# Patient Record
Sex: Male | Born: 1970
Health system: Southern US, Community
[De-identification: ages and names within clinical notes are randomized; demographics above are authoritative.]

## PROBLEM LIST (undated history)

## (undated) DIAGNOSIS — K219 Gastro-esophageal reflux disease without esophagitis: Secondary | ICD-10-CM

## (undated) DIAGNOSIS — I10 Essential (primary) hypertension: Secondary | ICD-10-CM

## (undated) DIAGNOSIS — E7800 Pure hypercholesterolemia, unspecified: Secondary | ICD-10-CM

## (undated) DIAGNOSIS — N189 Chronic kidney disease, unspecified: Secondary | ICD-10-CM

## (undated) DIAGNOSIS — K5900 Constipation, unspecified: Secondary | ICD-10-CM

## (undated) DIAGNOSIS — N19 Unspecified kidney failure: Secondary | ICD-10-CM

## (undated) DIAGNOSIS — M109 Gout, unspecified: Secondary | ICD-10-CM

## (undated) HISTORY — PX: NO PAST SURGERIES: SHX2092

---

## 2001-01-04 ENCOUNTER — Emergency Department (HOSPITAL_COMMUNITY): Admission: EM | Admit: 2001-01-04 | Discharge: 2001-01-04 | Payer: Self-pay | Admitting: Emergency Medicine

## 2004-09-09 ENCOUNTER — Ambulatory Visit: Payer: Self-pay | Admitting: Internal Medicine

## 2004-09-11 ENCOUNTER — Ambulatory Visit: Payer: Self-pay | Admitting: *Deleted

## 2011-08-13 ENCOUNTER — Encounter (HOSPITAL_COMMUNITY): Payer: Self-pay | Admitting: *Deleted

## 2011-08-13 ENCOUNTER — Emergency Department (HOSPITAL_COMMUNITY)
Admission: EM | Admit: 2011-08-13 | Discharge: 2011-08-14 | Disposition: A | Discharge status: Home / Self Care | Payer: Self-pay | Attending: Emergency Medicine | Admitting: Emergency Medicine

## 2011-08-13 DIAGNOSIS — I10 Essential (primary) hypertension: Secondary | ICD-10-CM | POA: Insufficient documentation

## 2011-08-13 DIAGNOSIS — R110 Nausea: Secondary | ICD-10-CM | POA: Insufficient documentation

## 2011-08-13 HISTORY — DX: Essential (primary) hypertension: I10

## 2011-08-13 MED ORDER — LOSARTAN POTASSIUM 25 MG PO TABS: 50.0000 mg | ORAL_TABLET | Freq: Every day | ORAL | Status: DC

## 2011-08-13 NOTE — ED Notes (Addendum)
Concerned with elevated blood pressure; feels like he chokes sometimes when eating food; nauseated when eating; feels like needles on skin

## 2011-08-13 NOTE — ED Provider Notes (Signed)
History     CSN: [id number]  Arrival date & time 08/13/11  2250   First MD Initiated Contact with Patient 08/13/11 2311      Chief Complaint  Patient presents with  . Nausea    (Consider location/radiation/quality/duration/timing/severity/associated sxs/prior treatment) HPI 41 year old male presents to emergency part complaining of nausea and elevated blood pressure. Patient has history of hypertension, currently treated with losartan. He's been on the medication for 3 years. Patient was unable to sleep last night, and had nausea. Nausea persisted through the day today. Patient does not have difficulty swallowing or choking sensation, but reports when he tries to eat the food does not taste good. He attributes this to his high blood pressure. Patient had episode of pins and needles in his skin last night and again today, which he also attributes to his high blood pressure. He denies any chest pain, shortness of breath, abdominal pain, blurred vision. Patient reports similar episode about a week ago, but did not check his blood pressure at the time. At home, his blood pressure was 150s over 90s. Patient reports his last checkup with his primary care physician was 3 months ago.  Past Medical History  Diagnosis Date  . Hypertension     History reviewed. No pertinent past surgical history.  No family history on file.  History  Substance Use Topics  . Smoking status: Never Smoker   . Smokeless tobacco: Not on file  . Alcohol Use: No      Review of Systems  All other systems reviewed and are negative.    Allergies  Review of patient's allergies indicates no known allergies.  Home Medications   Current Outpatient Rx  Name Route Sig Dispense Refill  . CALCIUM CARBONATE-VITAMIN D 500-200 MG-UNIT PO TABS Oral Take 1 tablet by mouth daily.    Marland Kitchen LOSARTAN POTASSIUM 25 MG PO TABS Oral Take 25 mg by mouth daily.    Marland Kitchen SIMVASTATIN 20 MG PO TABS Oral Take 20 mg by mouth every evening.       BP 164/101  Pulse 68  Temp 98.1 F (36.7 C) (Oral)  Resp 18  SpO2 100%  Physical Exam  Nursing note and vitals reviewed. Constitutional: He is oriented to person, place, and time. He appears well-developed and well-nourished. No distress.  HENT:  Head: Normocephalic and atraumatic.  Nose: Nose normal.  Mouth/Throat: Oropharynx is clear and moist. No oropharyngeal exudate.  Eyes: Conjunctivae and EOM are normal. Pupils are equal, round, and reactive to light.  Neck: Normal range of motion. Neck supple. No JVD present. No tracheal deviation present. No thyromegaly present.  Cardiovascular: Normal rate, regular rhythm, normal heart sounds and intact distal pulses.  Exam reveals no gallop and no friction rub.   No murmur heard. Pulmonary/Chest: Effort normal and breath sounds normal. No stridor. No respiratory distress. He has no wheezes. He has no rales. He exhibits no tenderness.  Abdominal: Soft. Bowel sounds are normal. He exhibits no distension and no mass. There is no tenderness. There is no rebound and no guarding.  Musculoskeletal: Normal range of motion. He exhibits no edema and no tenderness.  Lymphadenopathy:    He has no cervical adenopathy.  Neurological: He is alert and oriented to person, place, and time. He exhibits normal muscle tone. Coordination normal.  Skin: Skin is warm and dry. No rash noted. He is not diaphoretic. No erythema. No pallor.    ED Course  Procedures (including critical care time)  Labs Reviewed - No  data to display No results found.   1. Hypertension       MDM  41 year old male with hypertension. Patient is on lower dose of losartan, will increase to 50 mg daily. We'll have him keep a journal of blood pressures and followup with his primary care physician in one week. Patient without evidence of endorgan damage and his blood pressure is not dangerously elevated.        Kalman Drape, MD 08/13/11 2351

## 2011-10-02 ENCOUNTER — Inpatient Hospital Stay (HOSPITAL_COMMUNITY)
Admission: EM | Admit: 2011-10-02 | Discharge: 2011-10-10 | DRG: 674 | Disposition: A | Discharge status: Home / Self Care | Payer: Medicaid Other | Attending: Nephrology | Admitting: Nephrology

## 2011-10-02 ENCOUNTER — Encounter (HOSPITAL_COMMUNITY): Payer: Self-pay | Admitting: Emergency Medicine

## 2011-10-02 DIAGNOSIS — R809 Proteinuria, unspecified: Secondary | ICD-10-CM

## 2011-10-02 DIAGNOSIS — N186 End stage renal disease: Secondary | ICD-10-CM | POA: Diagnosis present

## 2011-10-02 DIAGNOSIS — D638 Anemia in other chronic diseases classified elsewhere: Secondary | ICD-10-CM | POA: Diagnosis present

## 2011-10-02 DIAGNOSIS — N179 Acute kidney failure, unspecified: Principal | ICD-10-CM | POA: Diagnosis present

## 2011-10-02 DIAGNOSIS — R609 Edema, unspecified: Secondary | ICD-10-CM

## 2011-10-02 DIAGNOSIS — R319 Hematuria, unspecified: Secondary | ICD-10-CM

## 2011-10-02 DIAGNOSIS — I120 Hypertensive chronic kidney disease with stage 5 chronic kidney disease or end stage renal disease: Secondary | ICD-10-CM | POA: Diagnosis present

## 2011-10-02 DIAGNOSIS — N2581 Secondary hyperparathyroidism of renal origin: Secondary | ICD-10-CM | POA: Diagnosis present

## 2011-10-02 HISTORY — DX: Chronic kidney disease, unspecified: N18.9

## 2011-10-02 LAB — URINE MICROSCOPIC-ADD ON

## 2011-10-02 LAB — CBC WITH DIFFERENTIAL/PLATELET
Basophils Absolute: 0 10*3/uL (ref 0.0–0.1)
Basophils Relative: 0 % (ref 0–1)
Eosinophils Absolute: 0.1 10*3/uL (ref 0.0–0.7)
Eosinophils Relative: 1 % (ref 0–5)
HCT: 30.8 % — ABNORMAL LOW (ref 39.0–52.0)
Hemoglobin: 10.5 g/dL — ABNORMAL LOW (ref 13.0–17.0)
Lymphocytes Relative: 14 % (ref 12–46)
Lymphs Abs: 0.7 10*3/uL (ref 0.7–4.0)
MCH: 28.7 pg (ref 26.0–34.0)
MCHC: 34.1 g/dL (ref 30.0–36.0)
MCV: 84.2 fL (ref 78.0–100.0)
Monocytes Absolute: 0.4 10*3/uL (ref 0.1–1.0)
Monocytes Relative: 8 % (ref 3–12)
Neutro Abs: 4.1 10*3/uL (ref 1.7–7.7)
Neutrophils Relative %: 78 % — ABNORMAL HIGH (ref 43–77)
Platelets: 104 10*3/uL — ABNORMAL LOW (ref 150–400)
RBC: 3.66 MIL/uL — ABNORMAL LOW (ref 4.22–5.81)
RDW: 12.2 % (ref 11.5–15.5)
WBC: 5.3 10*3/uL (ref 4.0–10.5)

## 2011-10-02 LAB — URINALYSIS, ROUTINE W REFLEX MICROSCOPIC
Bilirubin Urine: NEGATIVE
Glucose, UA: NEGATIVE mg/dL
Ketones, ur: NEGATIVE mg/dL
Leukocytes, UA: NEGATIVE
Nitrite: NEGATIVE
Protein, ur: >300 mg/dL — AB
Specific Gravity, Urine: 1.01 (ref 1.005–1.030)
Urobilinogen, UA: 0.2 mg/dL (ref 0.0–1.0)
pH: 7.5 (ref 5.0–8.0)

## 2011-10-02 LAB — BASIC METABOLIC PANEL
BUN: 134 mg/dL — ABNORMAL HIGH (ref 6–23)
CO2: 21 mEq/L (ref 19–32)
Calcium: 7.1 mg/dL — ABNORMAL LOW (ref 8.4–10.5)
Chloride: 99 mEq/L (ref 96–112)
Creatinine, Ser: 12.72 mg/dL — ABNORMAL HIGH (ref 0.50–1.35)
GFR calc Af Amer: 5 mL/min — ABNORMAL LOW (ref >90)
GFR calc non Af Amer: 4 mL/min — ABNORMAL LOW (ref >90)
Glucose, Bld: 124 mg/dL — ABNORMAL HIGH (ref 70–99)
Potassium: 5 mEq/L (ref 3.5–5.1)
Sodium: 140 mEq/L (ref 135–145)

## 2011-10-02 LAB — PHOSPHORUS: Phosphorus: 5.8 mg/dL — ABNORMAL HIGH (ref 2.3–4.6)

## 2011-10-02 LAB — MAGNESIUM: Magnesium: 2.3 mg/dL (ref 1.5–2.5)

## 2011-10-02 MED ORDER — HYDROCODONE-ACETAMINOPHEN 5-325 MG PO TABS
1.0000 | ORAL_TABLET | ORAL | Status: DC | PRN
  Administered 2011-10-05 – 2011-10-06 (×2): 2 via ORAL
  Administered 2011-10-07: 1 via ORAL
  Filled 2011-10-02: qty 2
  Filled 2011-10-02 (×2): qty 1
  Filled 2011-10-02: qty 2

## 2011-10-02 MED ORDER — SIMVASTATIN 20 MG PO TABS
20.0000 mg | ORAL_TABLET | Freq: Every evening | ORAL | Status: DC
  Administered 2011-10-02 – 2011-10-09 (×8): 20 mg via ORAL
  Filled 2011-10-02 (×9): qty 1

## 2011-10-02 MED ORDER — AMLODIPINE BESYLATE 5 MG PO TABS
5.0000 mg | ORAL_TABLET | Freq: Every day | ORAL | Status: DC
  Administered 2011-10-02 – 2011-10-10 (×9): 5 mg via ORAL
  Filled 2011-10-02 (×9): qty 1

## 2011-10-02 MED ORDER — SODIUM CHLORIDE 0.9 % IJ SOLN
3.0000 mL | Freq: Two times a day (BID) | INTRAMUSCULAR | Status: DC
  Administered 2011-10-02 – 2011-10-09 (×12): 3 mL via INTRAVENOUS

## 2011-10-02 NOTE — Consult Note (Signed)
Referring Provider: No ref. provider found Primary Care Physician:  No primary provider on file. Primary Nephrologist:  Dr. Florene Glen  Reason for Consultation:  Management of Stage 5 CKD   HPI Comments: Patient was sent from [facility] today for admission, patient with new onset end stage renal disease, BUN creatinine on paperwork 131 and 10.9. Newly discovered ESRD Asymptomatic   Past Medical History  Diagnosis Date  . Hypertension   . Chronic kidney disease     Past Surgical History  Procedure Date  . No past surgeries     Prior to Admission medications   Medication Sig Start Date End Date Taking? Authorizing Provider  amLODipine (NORVASC) 5 MG tablet Take 5 mg by mouth daily.   Yes Historical Provider, MD  calcium-vitamin D (OSCAL WITH D) 500-200 MG-UNIT per tablet Take 1 tablet by mouth daily.   Yes Historical Provider, MD  furosemide (LASIX) 20 MG tablet Take 20 mg by mouth daily.   Yes Historical Provider, MD  simvastatin (ZOCOR) 20 MG tablet Take 20 mg by mouth every evening.   Yes Historical Provider, MD    Current Facility-Administered Medications  Medication Dose Route Frequency Provider Last Rate Last Dose  . amLODipine (NORVASC) tablet 5 mg  5 mg Oral Daily Theodis Blaze, MD   5 mg at 10/02/11 1554  . HYDROcodone-acetaminophen (NORCO/VICODIN) 5-325 MG per tablet 1-2 tablet  1-2 tablet Oral Q4H PRN Theodis Blaze, MD      . simvastatin (ZOCOR) tablet 20 mg  20 mg Oral QPM Theodis Blaze, MD      . sodium chloride 0.9 % injection 3 mL  3 mL Intravenous Q12H Theodis Blaze, MD        Allergies as of 10/02/2011  . (No Known Allergies)    History reviewed. No pertinent family history.  History   Social History  . Marital Status: Single    Spouse Name: N/A    Number of Children: N/A  . Years of Education: N/A   Occupational History  . Not on file.   Social History Main Topics  . Smoking status: Never Smoker   . Smokeless tobacco: Never Used  .  Alcohol Use: No  . Drug Use: No  . Sexually Active: Yes    Birth Control/ Protection: None   Other Topics Concern  . Not on file   Social History Narrative  . No narrative on file    Review of Systems: Gen: Denies any fever, chills, sweats, anorexia, fatigue, weakness, malaise, weight loss, and sleep disorder HEENT: No visual complaints, No history of Retinopathy. Normal external appearance No Epistaxis or Sore throat. No sinusitis.   CV: Denies chest pain, angina, palpitations, syncope, orthopnea, PND, peripheral edema, and claudication. Resp: Denies dyspnea at rest, dyspnea with exercise, cough, sputum, wheezing, coughing up blood, and pleurisy. GI: Denies vomiting blood, jaundice, and fecal incontinence.   Denies dysphagia or odynophagia. GU : Denies urinary burning, blood in urine, urinary frequency, urinary hesitancy, nocturnal urination, and urinary incontinence.  No renal calculi. MS: Denies joint pain, limitation of movement, and swelling, stiffness, low back pain, extremity pain. Denies muscle weakness, cramps, atrophy.  No use of non steroidal antiinflammatory drugs. Derm: Denies rash, itching, dry skin, hives, moles, warts, or unhealing ulcers.  Psych: Denies depression, anxiety, memory loss, suicidal ideation, hallucinations, paranoia, and confusion. Heme: Denies bruising, bleeding, and enlarged lymph nodes. Neuro: No headache.  No diplopia. No dysarthria.  No dysphasia.  No history of  CVA.  No Seizures. No paresthesias.  No weakness. Endocrine No DM.  No Thyroid disease.  No Adrenal disease.  Physical Exam: Vital signs in last 24 hours: Temp:  [98.3 F (36.8 C)-98.8 F (37.1 C)] 98.8 F (37.1 C) (09/06 1527) Pulse Rate:  [82-86] 82  (09/06 1527) Resp:  [16-18] 18  (09/06 1527) BP: (135-140)/(80-89) 140/89 mmHg (09/06 1451) SpO2:  [98 %-100 %] 100 % (09/06 1527) Last BM Date: 10/01/11 General:   Alert,  Well-developed, well-nourished, pleasant and cooperative in  NAD Head:  Normocephalic and atraumatic. Eyes:  Sclera clear, no icterus.   Conjunctiva pink. Ears:  Normal auditory acuity. Nose:  No deformity, discharge,  or lesions. Mouth:  No deformity or lesions, dentition normal. Neck:  Supple; no masses or thyromegaly. JVP not elevated Lungs:  Clear throughout to auscultation.   No wheezes, crackles, or rhonchi. No acute distress. Heart:  Regular rate and rhythm; no murmurs, clicks, rubs,  or gallops. Abdomen:  Soft, nontender and nondistended. No masses, hepatosplenomegaly or hernias noted. Normal bowel sounds, without guarding, and without rebound.   Msk:  Symmetrical without gross deformities. Normal posture. Pulses:  No carotid, renal, femoral bruits. DP and PT symmetrical and equal Extremities:  Without clubbing 1 + edema Neurologic:  Alert and  oriented x4;  grossly normal neurologically. Skin:  Intact without significant lesions or rashes. Cervical Nodes:  No significant cervical adenopathy. Psych:  Alert and cooperative. Normal mood and affect.  Intake/Output from previous day:   Intake/Output this shift:    Lab Results:  Basename 10/02/11 1124  WBC 5.3  HGB 10.5*  HCT 30.8*  PLT 104*   BMET  Basename 10/02/11 1441 10/02/11 1124  NA -- 140  K -- 5.0  CL -- 99  CO2 -- 21  GLUCOSE -- 124*  BUN -- 134*  CREATININE -- 12.72*  CALCIUM -- 7.1*  PHOS 5.8* --   LFT No results found for this basename: PROT,ALBUMIN,AST,ALT,ALKPHOS,BILITOT,BILIDIR,IBILI in the last 72 hours PT/INR No results found for this basename: LABPROT:2,INR:2 in the last 72 hours Hepatitis Panel No results found for this basename: HEPBSAG,HCVAB,HEPAIGM,HEPBIGM in the last 72 hours  Studies/Results: No results found.  Assessment/Plan:  Advanced renal failure. Will need dialysis access planning and placement in dialysis center  Anemia Stable  Bones check PTH  HTN/VOL stable   LOS: 0 [place] W @TODAY @8 :07 PM

## 2011-10-02 NOTE — ED Provider Notes (Signed)
History     CSN: [id number]  Arrival date & time 10/02/11  1110   First MD Initiated Contact with Patient 10/02/11 1236      Chief Complaint  Patient presents with  . Abnormal Lab  . Flank Pain    (Consider location/radiation/quality/duration/timing/severity/associated sxs/prior treatment) HPI Comments: Patient was sent from [facility] today for admission, patient with new onset end stage renal disease, BUN creatinine on paperwork 131 and 10.9.  Pt also with proteinuria and hypertension.  Pt reports he has developed bilateral lower extremity edema x 4 days.  Denies any other complaints.  States his blood pressure has been fairly well controlled, though he does note he has daily dizziness and blurry vision when his blood pressure is not controlled - denies these symptoms at present.  Denies CP, SOB, abdominal pain, leg pain.    The history is provided by the patient.    Past Medical History  Diagnosis Date  . Hypertension     History reviewed. No pertinent past surgical history.  History reviewed. No pertinent family history.  History  Substance Use Topics  . Smoking status: Never Smoker   . Smokeless tobacco: Not on file  . Alcohol Use: No      Review of Systems  Constitutional: Negative for appetite change.  Respiratory: Negative for shortness of breath.   Cardiovascular: Negative for chest pain.  Gastrointestinal: Negative for abdominal pain.    Allergies  Review of patient's allergies indicates no known allergies.  Home Medications   Current Outpatient Rx  Name Route Sig Dispense Refill  . AMLODIPINE BESYLATE 5 MG PO TABS Oral Take 5 mg by mouth daily.    Marland Kitchen CALCIUM CARBONATE-VITAMIN D 500-200 MG-UNIT PO TABS Oral Take 1 tablet by mouth daily.    . FUROSEMIDE 20 MG PO TABS Oral Take 20 mg by mouth daily.    Marland Kitchen SIMVASTATIN 20 MG PO TABS Oral Take 20 mg by mouth every evening.      BP 135/80  Pulse 86  Temp 98.3 F (36.8 C) (Oral)  Resp 18   SpO2 100%  Physical Exam  Nursing note and vitals reviewed. Constitutional: He appears well-developed and well-nourished. No distress.  HENT:  Head: Normocephalic and atraumatic.  Neck: Neck supple.  Cardiovascular: Normal rate and regular rhythm.   Pulmonary/Chest: Effort normal and breath sounds normal. No respiratory distress. He has no wheezes. He has no rales.  Abdominal: Soft. He exhibits no distension. There is no tenderness. There is no rebound and no guarding.  Musculoskeletal: He exhibits edema.       Lower extremities with peripheral edema.  Nontender.  Distal pulses intact.    Neurological: He is alert. He exhibits normal muscle tone.  Skin: He is not diaphoretic.    ED Course  Procedures (including critical care time)  Labs Reviewed  CBC WITH DIFFERENTIAL - Abnormal; Notable for the following:    RBC 3.66 (*)     Hemoglobin 10.5 (*)     HCT 30.8 (*)     Platelets 104 (*)  PLATELET COUNT CONFIRMED BY SMEAR   Neutrophils Relative 78 (*)     All other components within normal limits  BASIC METABOLIC PANEL - Abnormal; Notable for the following:    Glucose, Bld 124 (*)     BUN 134 (*)     Creatinine, Ser 12.72 (*)     Calcium 7.1 (*)     GFR calc non Af Amer 4 (*)  GFR calc Af Amer 5 (*)     All other components within normal limits  URINALYSIS, ROUTINE W REFLEX MICROSCOPIC - Abnormal; Notable for the following:    Hgb urine dipstick LARGE (*)     Protein, ur >300 (*)     All other components within normal limits  URINE MICROSCOPIC-ADD ON  PHOSPHORUS  MAGNESIUM  TSH  ANA  COMPLEMENT, TOTAL  URINE RAPID DRUG SCREEN (HOSP PERFORMED)  HIV ANTIBODY (ROUTINE TESTING)   No results found.  1:22 PM I spoke with Dr Justin Mend of [facility] who is aware of the patient.  States they would like hospitalists to admit and they will consult and do dialysis.    2:38 PM Admitted to Dr Doyle Askew, [facility].    1. Acute renal failure   2. Hematuria   3.  Proteinuria   4. Peripheral edema       MDM  Pt sent by [facility] for new onset kidney failure.  Pt's only complaint is peripheral edema, otherwise is asymptomatic.  Pt admitted to [facility], [facility] to consult and dialyze patient.          [place], [place] 10/02/11 1531

## 2011-10-02 NOTE — ED Notes (Signed)
States that he has been going to the dr for a year went today for check up and had high creatine and bun sent for possibel dialysis cath/graft

## 2011-10-02 NOTE — ED Notes (Signed)
Pt was given a sandwich at his requesi.  Report given to 6700

## 2011-10-02 NOTE — Progress Notes (Signed)
Received pt. From ED,pt. Alert and oriented,pt. Is from home with family.pt. Ambulates independently.pt. eas place on telemetry.

## 2011-10-02 NOTE — H&P (Addendum)
[facility] History and Physical  Cole [name] [id number] DOB: 1970/07/18 DOA: 10/02/2011  Referring physician: ED physician PCP: No primary provider on file.   Chief Complaint: Pt sent from [facility] office with abnormal renal function tests  HPI:  Pt is 41 yo male with history of uncontrolled hypertension who presents from [place] for further evaluation of new onset renal failure. He reports noticing lower extremity swelling of 4 days duration and intermittent episodes of dizziness with blurry vision. He denies any chest pain or shortness of breath, no abdominal or specific urinary concerns, no chest pain, no cough, no fevers, no chills, no other systemic symptoms.   Assessment and Plan:  ARF - likely secondary to uncontrolled HTN - appreciate nephrology team guidance - will continue to follow up on recommendations - will obtain PTH levels, ANA and complement level as well - HD work up, hepatitis panel, will also check HIV and UDS  HTN, accelerated - will continue home medication regimen and will readjust as indicated  Anemia of Chronic disease - CBC in AM - anemia panel  Code Status: Full Family Communication: Pt at bedside Disposition Plan: Home when medically stable   Review of Systems:  Constitutional: Negative for fever, chills and malaise/fatigue. Negative for diaphoresis.  HENT: Negative for hearing loss, ear pain, nosebleeds, congestion, sore throat, neck pain, tinnitus and ear discharge.   Eyes: Negative for blurred vision, double vision, photophobia, pain, discharge and redness.  Respiratory: Negative for cough, hemoptysis, sputum production, shortness of breath, wheezing and stridor.   Cardiovascular: Negative for chest pain, palpitations, orthopnea, claudication, positive for leg swelling.  Gastrointestinal: Negative for nausea, vomiting and abdominal pain. Negative for heartburn, constipation, blood in stool and melena.    Genitourinary: Negative for dysuria, urgency, frequency, hematuria and flank pain.  Musculoskeletal: Negative for myalgias, back pain, joint pain and falls.  Skin: Negative for itching and rash.  Neurological: Negative for weakness. Negative for tingling, tremors, sensory change, speech change, focal weakness, loss of consciousness and headaches.  Endo/Heme/Allergies: Negative for environmental allergies and polydipsia. Does not bruise/bleed easily.  Psychiatric/Behavioral: Negative for suicidal ideas. The patient is not nervous/anxious.      Past Medical History  Diagnosis Date  . Hypertension     No family history of kidney disease.  Social History:  reports that he has never smoked. He does not have any smokeless tobacco history on file. He reports that he does not drink alcohol. His drug history not on file.  No Known Allergies   Prior to Admission medications   Medication Sig Start Date End Date Taking? Authorizing Provider  amLODipine (NORVASC) 5 MG tablet Take 5 mg by mouth daily.   Yes Historical Provider, MD  calcium-vitamin D (OSCAL WITH D) 500-200 MG-UNIT per tablet Take 1 tablet by mouth daily.   Yes Historical Provider, MD  furosemide (LASIX) 20 MG tablet Take 20 mg by mouth daily.   Yes Historical Provider, MD  simvastatin (ZOCOR) 20 MG tablet Take 20 mg by mouth every evening.   Yes Historical Provider, MD    Physical Exam: Filed Vitals:   10/02/11 1116  BP: 135/80  Pulse: 86  Temp: 98.3 F (36.8 C)  TempSrc: Oral  Resp: 18  SpO2: 100%    Physical Exam  Constitutional: Appears well-developed and well-nourished. No distress.  HENT: Normocephalic. External right and left ear normal. Oropharynx is clear and moist.  Eyes: Conjunctivae and EOM are normal. PERRLA, no scleral icterus.  Neck: Normal ROM. Neck  supple. No JVD. No tracheal deviation. No thyromegaly.  CVS: RRR, S1/S2 +, no murmurs, no gallops, no carotid bruit.  Pulmonary: Effort and breath sounds  normal, no stridor, rhonchi, wheezes, rales.  Abdominal: Soft. BS +,  no distension, tenderness, rebound or guarding.  Musculoskeletal: Normal range of motion. No tenderness. Bilateral + 1 pitting lower extremity edema.  Lymphadenopathy: No lymphadenopathy noted, cervical, inguinal. Neuro: Alert. Normal reflexes, muscle tone coordination. No cranial nerve deficit. Skin: Skin is warm and dry. No rash noted. Not diaphoretic. No erythema. No pallor.  Psychiatric: Normal mood and affect. Behavior, judgment, thought content normal.   Labs on Admission:  Basic Metabolic Panel:  Lab 0000000 1124  NA 140  K 5.0  CL 99  CO2 21  GLUCOSE 124*  BUN 134*  CREATININE 12.72*  CALCIUM 7.1*  MG --  PHOS --   CBC:  Lab 10/02/11 1124  WBC 5.3  NEUTROABS 4.1  HGB 10.5*  HCT 30.8*  MCV 84.2  PLT 104*   Radiological Exams on Admission: No results found.  EKG: Normal sinus rhythm, no ST/T wave changes  Faye Ramsay, MD  [facility] Pager [phone]  If 7PM-7AM, please contact night-coverage [email or web address] Password [email or web address] 10/02/2011, 2:29 PM

## 2011-10-02 NOTE — ED Notes (Addendum)
Pt sent here from [place] for eval of new onset ESRF; pt with paperwork and has abnormal labs; pt noted to have bilateral LE edema

## 2011-10-02 NOTE — ED Notes (Signed)
Pt C/o bilateral intermittent flank pain x2years. Was sent from [facility] for possible ESRD. Pt has BLE edema. Pt states he is urinating well, except "there are too many bubble." Denies pain with urination.

## 2011-10-02 NOTE — ED Provider Notes (Signed)
Medical screening examination/treatment/procedure(s) were performed by non-physician practitioner and as supervising physician I was immediately available for consultation/collaboration.   Wandra Arthurs, MD 10/02/11 [phone]

## 2011-10-03 LAB — CBC
HCT: 26.5 % — ABNORMAL LOW (ref 39.0–52.0)
Hemoglobin: 8.9 g/dL — ABNORMAL LOW (ref 13.0–17.0)
MCH: 28.4 pg (ref 26.0–34.0)
MCHC: 33.6 g/dL (ref 30.0–36.0)
MCV: 84.7 fL (ref 78.0–100.0)
Platelets: 96 10*3/uL — ABNORMAL LOW (ref 150–400)
RBC: 3.13 MIL/uL — ABNORMAL LOW (ref 4.22–5.81)
RDW: 12.3 % (ref 11.5–15.5)
WBC: 5.2 10*3/uL (ref 4.0–10.5)

## 2011-10-03 LAB — GLUCOSE, CAPILLARY: Glucose-Capillary: 93 mg/dL (ref 70–99)

## 2011-10-03 LAB — HEPATITIS B SURFACE ANTIGEN: Hepatitis B Surface Ag: NEGATIVE

## 2011-10-03 LAB — RENAL FUNCTION PANEL
Albumin: 3.3 g/dL — ABNORMAL LOW (ref 3.5–5.2)
BUN: 137 mg/dL — ABNORMAL HIGH (ref 6–23)
CO2: 21 mEq/L (ref 19–32)
Calcium: 6.7 mg/dL — ABNORMAL LOW (ref 8.4–10.5)
Chloride: 100 mEq/L (ref 96–112)
Creatinine, Ser: 13.48 mg/dL — ABNORMAL HIGH (ref 0.50–1.35)
GFR calc Af Amer: 5 mL/min — ABNORMAL LOW (ref >90)
GFR calc non Af Amer: 4 mL/min — ABNORMAL LOW (ref >90)
Glucose, Bld: 99 mg/dL (ref 70–99)
Phosphorus: 5.4 mg/dL — ABNORMAL HIGH (ref 2.3–4.6)
Potassium: 4.9 mEq/L (ref 3.5–5.1)
Sodium: 137 mEq/L (ref 135–145)

## 2011-10-03 LAB — IRON AND TIBC
Iron: 93 ug/dL (ref 42–135)
Saturation Ratios: 37 % (ref 20–55)
TIBC: 250 ug/dL (ref 215–435)
UIBC: 157 ug/dL (ref 125–400)

## 2011-10-03 LAB — HIV ANTIBODY (ROUTINE TESTING W REFLEX): HIV: NONREACTIVE

## 2011-10-03 LAB — PROTEIN / CREATININE RATIO, URINE
Creatinine, Urine: 46.45 mg/dL
Protein Creatinine Ratio: 4.95 — ABNORMAL HIGH (ref 0.00–0.15)
Total Protein, Urine: 229.8 mg/dL

## 2011-10-03 LAB — TSH: TSH: 1.501 u[IU]/mL (ref 0.350–4.500)

## 2011-10-03 MED ORDER — DARBEPOETIN ALFA-POLYSORBATE 60 MCG/0.3ML IJ SOLN
60.0000 ug | Freq: Once | INTRAMUSCULAR | Status: AC
  Administered 2011-10-03: 60 ug via SUBCUTANEOUS
  Filled 2011-10-03: qty 0.3

## 2011-10-03 NOTE — Progress Notes (Signed)
Patient ID: Cole [name], male   DOB: 1970-07-31, 41 y.o.   MRN: [id number]  [facility] PROGRESS NOTE  Cole [name] [id number] DOB: 15-Sep-1970 DOA: 10/02/2011 PCP: No primary provider on file.  Brief narrative: Pt is 41 yo male with history of uncontrolled hypertension who presents from [place] for further evaluation of new onset renal failure. He reports noticing lower extremity swelling of 4 days duration and intermittent episodes of dizziness with blurry vision. He denies any chest pain or shortness of breath, no abdominal or specific urinary concerns, no chest pain, no cough, no fevers, no chills, no other systemic symptoms.   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   Objective: Filed Vitals:   10/02/11 1527 10/02/11 2052 10/03/11 0556 10/03/11 1050  BP:  127/83 129/80 144/90  Pulse: 82 79 84 82  Temp: 98.8 F (37.1 C) 98.1 F (36.7 C) 98.1 F (36.7 C) 97.7 F (36.5 C)  TempSrc: Oral Oral Oral Oral  Resp: 18 16 16 18   Weight:  61.009 kg (134 lb 8 oz)    SpO2: 100% 100% 100% 100%    Intake/Output Summary (Last 24 hours) at 10/03/11 1352 Last data filed at 10/03/11 0900  Gross per 24 hour  Intake    600 ml  Output   2075 ml  Net  -1475 ml    Exam:   General:  Pt is alert, follows commands appropriately, not in acute distress  Cardiovascular:  Regular rate and rhythm, S1/S2, no murmurs, no rubs, no gallops  Respiratory: Clear to auscultation bilaterally, no wheezing, no crackles, no rhonchi  Abdomen: Soft, non tender, non distended, bowel sounds present, no guarding  Extremities: No edema, pulses DP and PT palpable bilaterally  Neuro: Grossly nonfocal  Data Reviewed: Basic Metabolic Panel:  Lab [id number] 10/02/11 1441 10/02/11 1124  NA 137 -- 140  K 4.9 -- 5.0  CL 100 -- 99  CO2 21 -- 21  GLUCOSE 99 -- 124*  BUN 137* -- 134*  CREATININE 13.48* -- 12.72*  CALCIUM 6.7* -- 7.1*  MG -- 2.3 --  PHOS 5.4* 5.8* --   Liver Function Tests:  Lab 10/03/11 0705  AST --  ALT --  ALKPHOS --  BILITOT --  PROT --  ALBUMIN 3.3*   CBC:  Lab 10/03/11 0705 10/02/11 1124  WBC 5.2 5.3  NEUTROABS -- 4.1  HGB 8.9* 10.5*  HCT 26.5* 30.8*  MCV 84.7 84.2  PLT 96* 104*   CBG:  Lab 10/03/11 0741  GLUCAP 93     Scheduled Meds:   . amLODipine  5 mg Oral Daily  . darbepoetin (ARANESP) injection - DIALYSIS  60 mcg Subcutaneous Once  . simvastatin  20 mg Oral QPM  . sodium chloride  3 mL Intravenous Q12H   Continuous Infusions:    Faye Ramsay, MD  [facility] Pager [phone]  If 7PM-7AM, please contact night-coverage [email or web address] Password [email or web address] 10/03/2011, 1:52 PM   LOS: 1 day

## 2011-10-03 NOTE — Progress Notes (Signed)
[facility] ROUNDING NOTE   Subjective:   Interval History:none  Objective:  Vital signs in last 24 hours:  Temp:  [98.1 F (36.7 C)-98.8 F (37.1 C)] 98.1 F (36.7 C) (09/07 0556) Pulse Rate:  [79-86] 84  (09/07 0556) Resp:  [16-18] 16  (09/07 0556) BP: (127-140)/(80-89) 129/80 mmHg (09/07 0556) SpO2:  [98 %-100 %] 100 % (09/07 0556) Weight:  [61.009 kg (134 lb 8 oz)] 61.009 kg (134 lb 8 oz) (09/06 2052)  Weight change:  Filed Weights   10/02/11 2052  Weight: 61.009 kg (134 lb 8 oz)    Intake/Output: I/O last 3 completed shifts: In: 600 [P.O.:600] Out: 1550 [Urine:1550]   Intake/Output this shift:     CVS- RRR RS- CTA ABD- BS present soft non-distended EXT- no edema   Basic Metabolic Panel:  Lab [id number] 0705 10/02/11 1441 10/02/11 1124  NA 137 -- 140  K 4.9 -- 5.0  CL 100 -- 99  CO2 21 -- 21  GLUCOSE 99 -- 124*  BUN 137* -- 134*  CREATININE 13.48* -- 12.72*  CALCIUM 6.7* -- 7.1*  MG -- 2.3 --  PHOS 5.4* 5.8* --    Liver Function Tests:  Lab 10/03/11 0705  AST --  ALT --  ALKPHOS --  BILITOT --  PROT --  ALBUMIN 3.3*   No results found for this basename: LIPASE:5,AMYLASE:5 in the last 168 hours No results found for this basename: AMMONIA:3 in the last 168 hours  CBC:  Lab 10/03/11 0705 10/02/11 1124  WBC 5.2 5.3  NEUTROABS -- 4.1  HGB 8.9* 10.5*  HCT 26.5* 30.8*  MCV 84.7 84.2  PLT 96* 104*    Cardiac Enzymes: No results found for this basename: CKTOTAL:5,CKMB:5,CKMBINDEX:5,TROPONINI:5 in the last 168 hours  BNP: No components found with this basename: POCBNP:5  CBG:  Lab 10/03/11 0741  GLUCAP 93    Microbiology: No results found for this or any previous visit.  Coagulation Studies: No results found for this basename: LABPROT:5,INR:5 in the last 72 hours  Urinalysis:  Basename 10/02/11 1257  COLORURINE YELLOW  LABSPEC 1.010  PHURINE 7.5  GLUCOSEU NEGATIVE  HGBUR LARGE*  BILIRUBINUR NEGATIVE    KETONESUR NEGATIVE  PROTEINUR >300*  UROBILINOGEN 0.2  NITRITE NEGATIVE  LEUKOCYTESUR NEGATIVE      Imaging: No results found.   Medications:        . amLODipine  5 mg Oral Daily  . simvastatin  20 mg Oral QPM  . sodium chloride  3 mL Intravenous Q12H   HYDROcodone-acetaminophen  Assessment/ Plan:   Will be a new start to dialysis. Will contact [facility] to place Catheter and Fistula. Will need CLIP  HTN controlled  Serologies pending hep b neg and HIV neg  PTH pending]  Anemia Fe stores look OK start Aranesp   LOS: 1 Cole [name] W @TODAY @10 :00 AM

## 2011-10-04 ENCOUNTER — Encounter (HOSPITAL_COMMUNITY): Payer: Self-pay | Admitting: *Deleted

## 2011-10-04 DIAGNOSIS — Z01810 Encounter for preprocedural cardiovascular examination: Secondary | ICD-10-CM

## 2011-10-04 DIAGNOSIS — N186 End stage renal disease: Secondary | ICD-10-CM

## 2011-10-04 LAB — HEMOGLOBIN A1C
Hgb A1c MFr Bld: 5.4 % (ref <5.7)
Mean Plasma Glucose: 108 mg/dL (ref <117)

## 2011-10-04 LAB — GLUCOSE, CAPILLARY: Glucose-Capillary: 96 mg/dL (ref 70–99)

## 2011-10-04 MED ORDER — LIDOCAINE-PRILOCAINE 2.5-2.5 % EX CREA: 1.0000 "application " | TOPICAL_CREAM | CUTANEOUS | Status: DC | PRN

## 2011-10-04 MED ORDER — NEPRO/CARBSTEADY PO LIQD: 237.0000 mL | ORAL | Status: DC | PRN

## 2011-10-04 MED ORDER — SODIUM CHLORIDE 0.9 % IV SOLN: 100.0000 mL | INTRAVENOUS | Status: DC | PRN

## 2011-10-04 MED ORDER — HEPARIN SODIUM (PORCINE) 1000 UNIT/ML DIALYSIS
1000.0000 [IU] | INTRAMUSCULAR | Status: DC | PRN
  Administered 2011-10-07: 4600 [IU] via INTRAVENOUS_CENTRAL
  Filled 2011-10-04: qty 1

## 2011-10-04 MED ORDER — MIDAZOLAM HCL 2 MG/2ML IJ SOLN: 1.0000 mg | INTRAMUSCULAR | Status: DC | PRN

## 2011-10-04 MED ORDER — LIDOCAINE HCL (PF) 1 % IJ SOLN: 5.0000 mL | INTRAMUSCULAR | Status: DC | PRN

## 2011-10-04 MED ORDER — CEFAZOLIN SODIUM 500 MG IJ SOLR: 500.0000 mg | Freq: Three times a day (TID) | INTRAMUSCULAR | Status: DC

## 2011-10-04 MED ORDER — HEPARIN SODIUM (PORCINE) 1000 UNIT/ML DIALYSIS
20.0000 [IU]/kg | INTRAMUSCULAR | Status: DC | PRN
  Filled 2011-10-04: qty 2

## 2011-10-04 MED ORDER — ALTEPLASE 2 MG IJ SOLR
2.0000 mg | Freq: Once | INTRAMUSCULAR | Status: AC | PRN
  Filled 2011-10-04: qty 2

## 2011-10-04 MED ORDER — DEXTROSE 5 % IV SOLN
1.5000 g | INTRAVENOUS | Status: AC
  Administered 2011-10-05: 1.5 g via INTRAVENOUS
  Filled 2011-10-04: qty 1.5

## 2011-10-04 MED ORDER — FENTANYL CITRATE 0.05 MG/ML IJ SOLN: 50.0000 ug | INTRAMUSCULAR | Status: DC | PRN

## 2011-10-04 MED ORDER — PENTAFLUOROPROP-TETRAFLUOROETH EX AERO: 1.0000 "application " | INHALATION_SPRAY | CUTANEOUS | Status: DC | PRN

## 2011-10-04 NOTE — Consult Note (Signed)
  Vascular Surgery Consultation  Reason for Consult: Vascular access  HPI: Cole [name] is a 41 y.o. male who presents for evaluation of vascular access. Patient is right handed. Patient has never had hemodialysis in the past. Renal failure thought to be due to hypertension.   Past Medical History  Diagnosis Date  . Hypertension   . Chronic kidney disease    Past Surgical History  Procedure Date  . No past surgeries    History   Social History  . Marital Status: Single    Spouse Name: N/A    Number of Children: N/A  . Years of Education: N/A   Social History Main Topics  . Smoking status: Never Smoker   . Smokeless tobacco: Never Used  . Alcohol Use: No  . Drug Use: No  . Sexually Active: Yes    Birth Control/ Protection: None   Other Topics Concern  . None   Social History Narrative  . None   History reviewed. No pertinent family history. No Known Allergies Prior to Admission medications   Medication Sig Start Date End Date Taking? Authorizing Provider  amLODipine (NORVASC) 5 MG tablet Take 5 mg by mouth daily.   Yes Historical Provider, MD  calcium-vitamin D (OSCAL WITH D) 500-200 MG-UNIT per tablet Take 1 tablet by mouth daily.   Yes Historical Provider, MD  furosemide (LASIX) 20 MG tablet Take 20 mg by mouth daily.   Yes Historical Provider, MD  simvastatin (ZOCOR) 20 MG tablet Take 20 mg by mouth every evening.   Yes Historical Provider, MD     Positive ROS: Denies chest pain, claudication, lateralizing weakness.  All other systems have been reviewed and were otherwise negative with the exception of those mentioned in the HPI and as above.  Physical Exam: Filed Vitals:   10/04/11 0528  BP: 134/85  Pulse: 82  Temp: 98.2 F (36.8 C)  Resp: 18    General: Alert, no acute distress HEENT: Normal for age Cardiovascular: Regular rate and rhythm. Carotid pulses 2+, no bruits audible Respiratory: Clear to auscultation. No cyanosis, no use of  accessory musculature GI: No organomegaly, abdomen is soft and non-tender Skin: No lesions in the area of chief complaint Neurologic: Sensation intact distally Psychiatric: Patient is competent for consent with normal mood and affect Musculoskeletal: No obvious deformities Extremities: Left upper extremity with 3+ brachial and radial pulse palpable. Forearm cephalic vein appears satisfactory.    Assessment/Plan:  41 year old male with end-stage renal disease needs vascular access plus hemodialysis catheter  Plan #1 vein mapping bilateral upper extremities today #2 planned AV fistula and insertion dialysis catheter in a.m. if schedule permits #3 discussed this with patient and he would like to proceed   Tinnie Gens, MD 10/04/2011 8:46 AM

## 2011-10-04 NOTE — Progress Notes (Signed)
VASCULAR LAB PRELIMINARY  PRELIMINARY  PRELIMINARY  PRELIMINARY  Right  Upper Extremity Vein Map    Cephalic  Segment Diameter Depth Comment  1. Axilla 4.88mm mm   2. Mid upper arm 3.27mm mm   3. Above AC 3.33mm mm   4. In AC 4.39mm mm branch       5. Mid forearm 3.74mm mm branch  6. Wrist 2.107mm mm    mm mm    mm mm    mm mm    Basilic  Segment Diameter Depth Comment  1. Axilla 10.40mm 7.72mm   2. Mid upper arm 5.28mm 8.35mm   3. Above AC 5.24mm 4.15mm   4. In [facility] 6.65mm 3.26mm branch  5. Below AC 3.80mm 2.8mm   6. Mid forearm 2.2mm 2.31mm   7. Wrist 2.58mm 1.61mm    mm mm    mm mm    mm mm     Left Upper Extremity Vein Map    Cephalic  Segment Diameter Depth Comment  1. Axilla 30mm mm   2. Mid upper arm 2.70mm mm   3. Above AC 2.9mm mm   4. In AC 3.81mm mm   5. Below AC 1.77mm mm   6. Mid forearm 1.85mm mm   7. Wrist 1.57mm mm    mm mm    mm mm    mm mm    Basilic  Segment Diameter Depth Comment  1. Axilla 6.60mm 6.35mm   2. Mid upper arm 3.4mm 4.55mm branch  3. Above [place]   Multiple branches  4. In [facility] 4.63mm 4.89mm branch  5. Below AC   Multiple branches  6. Mid forearm 3.56mm 1.43mm   7. Wrist mm mm    mm mm    mm mm    mm mm         Sharion Dove, RVS Hunter, RDMS, RVT  10/04/2011, 12:11 PM

## 2011-10-04 NOTE — Progress Notes (Signed)
Obtained consent from patient for hemodialysis catheter placement and Left arm AV fistula placement.Patient verbalized he understood what the doctor explained to him. Matisyn Cabeza Joselita,RN

## 2011-10-04 NOTE — Progress Notes (Signed)
[facility] ROUNDING NOTE   Subjective:   Interval History: none, ate well, sleeps with difficulty. Plan for AVF and Perm cath in AM  Objective:  Vital signs in last 24 hours:  Temp:  [97.6 F (36.4 C)-98.4 F (36.9 C)] 98.4 F (36.9 C) (09/08 0950) Pulse Rate:  [77-88] 88  (09/08 0950) Resp:  [18-20] 18  (09/08 0950) BP: (129-146)/(85-90) 146/89 mmHg (09/08 0950) SpO2:  [99 %-100 %] 99 % (09/08 0950) Weight:  [62.052 kg (136 lb 12.8 oz)] 62.052 kg (136 lb 12.8 oz) (09/07 2050)  Weight change: 1.043 kg (2 lb 4.8 oz) Filed Weights   10/02/11 2052 10/03/11 2050  Weight: 61.009 kg (134 lb 8 oz) 62.052 kg (136 lb 12.8 oz)    Intake/Output: I/O last 3 completed shifts: In: 1320 [P.O.:1320] Out: 3076 [Urine:3075; Stool:1]   Intake/Output this shift:  Total I/O In: -  Out: 1175 [Urine:1175]  CVS- RRR RS- CTA ABD- BS present soft non-distended EXT- no edema   Basic Metabolic Panel:  Lab [id number] 0705 10/02/11 1441 10/02/11 1124  NA 137 -- 140  K 4.9 -- 5.0  CL 100 -- 99  CO2 21 -- 21  GLUCOSE 99 -- 124*  BUN 137* -- 134*  CREATININE 13.48* -- 12.72*  CALCIUM 6.7* -- 7.1*  MG -- 2.3 --  PHOS 5.4* 5.8* --    Liver Function Tests:  Lab 10/03/11 0705  AST --  ALT --  ALKPHOS --  BILITOT --  PROT --  ALBUMIN 3.3*   No results found for this basename: LIPASE:5,AMYLASE:5 in the last 168 hours No results found for this basename: AMMONIA:3 in the last 168 hours  CBC:  Lab 10/03/11 0705 10/02/11 1124  WBC 5.2 5.3  NEUTROABS -- 4.1  HGB 8.9* 10.5*  HCT 26.5* 30.8*  MCV 84.7 84.2  PLT 96* 104*    Cardiac Enzymes: No results found for this basename: CKTOTAL:5,CKMB:5,CKMBINDEX:5,TROPONINI:5 in the last 168 hours  BNP: No components found with this basename: POCBNP:5  CBG:  Lab 10/04/11 0739 10/03/11 0741  GLUCAP 96 93    Microbiology: No results found for this or any previous visit.  Coagulation Studies: No results found for  this basename: LABPROT:5,INR:5 in the last 72 hours  Urinalysis:  Basename 10/02/11 1257  COLORURINE YELLOW  LABSPEC 1.010  PHURINE 7.5  GLUCOSEU NEGATIVE  HGBUR LARGE*  BILIRUBINUR NEGATIVE  KETONESUR NEGATIVE  PROTEINUR >300*  UROBILINOGEN 0.2  NITRITE NEGATIVE  LEUKOCYTESUR NEGATIVE      Imaging: No results found.   Medications:        . amLODipine  5 mg Oral Daily  . cefUROXime (ZINACEF)  IV  1.5 g Intravenous On Call to OR  . darbepoetin (ARANESP) injection - DIALYSIS  60 mcg Subcutaneous Once  . simvastatin  20 mg Oral QPM  . sodium chloride  3 mL Intravenous Q12H  . DISCONTD: ceFAZolin  500 mg Intramuscular Q8H   HYDROcodone-acetaminophen  Assessment/ Plan:  Will be a new start to dialysis.   Will contact [facility] to place Catheter and Fistula.   Will need CLIP HTN controlled   Serologies  hep b neg and HIV neg PTH pending  Anemia Fe stores look OK start Aranesp  Will write for first treatment on dilaysis in AM, Social worker to see patient in AM for emergency medicaid application this week   LOS: 2 Rashawn Rolon W @TODAY @10 :01 AM

## 2011-10-05 ENCOUNTER — Inpatient Hospital Stay (HOSPITAL_COMMUNITY): Payer: Medicaid Other

## 2011-10-05 ENCOUNTER — Encounter (HOSPITAL_COMMUNITY)
Admission: EM | Disposition: A | Discharge status: Home / Self Care | Payer: Self-pay | Source: Home / Self Care | Attending: Nephrology

## 2011-10-05 ENCOUNTER — Encounter (HOSPITAL_COMMUNITY): Payer: Self-pay | Admitting: *Deleted

## 2011-10-05 ENCOUNTER — Telehealth: Payer: Self-pay | Admitting: Vascular Surgery

## 2011-10-05 ENCOUNTER — Other Ambulatory Visit: Payer: Self-pay | Admitting: *Deleted

## 2011-10-05 ENCOUNTER — Encounter (HOSPITAL_COMMUNITY): Payer: Self-pay | Admitting: Anesthesiology

## 2011-10-05 ENCOUNTER — Inpatient Hospital Stay (HOSPITAL_COMMUNITY): Payer: Medicaid Other | Admitting: Anesthesiology

## 2011-10-05 DIAGNOSIS — Z4931 Encounter for adequacy testing for hemodialysis: Secondary | ICD-10-CM

## 2011-10-05 DIAGNOSIS — N186 End stage renal disease: Secondary | ICD-10-CM

## 2011-10-05 HISTORY — PX: INSERTION OF DIALYSIS CATHETER: SHX1324

## 2011-10-05 HISTORY — PX: AV FISTULA PLACEMENT: SHX1204

## 2011-10-05 LAB — CBC
HCT: 26.5 % — ABNORMAL LOW (ref 39.0–52.0)
HCT: 28.5 % — ABNORMAL LOW (ref 39.0–52.0)
Hemoglobin: 9.3 g/dL — ABNORMAL LOW (ref 13.0–17.0)
Hemoglobin: 9.9 g/dL — ABNORMAL LOW (ref 13.0–17.0)
MCH: 28.9 pg (ref 26.0–34.0)
MCH: 28.7 pg (ref 26.0–34.0)
MCHC: 35.1 g/dL (ref 30.0–36.0)
MCHC: 34.7 g/dL (ref 30.0–36.0)
MCV: 82.3 fL (ref 78.0–100.0)
MCV: 82.6 fL (ref 78.0–100.0)
Platelets: 103 10*3/uL — ABNORMAL LOW (ref 150–400)
Platelets: 100 10*3/uL — ABNORMAL LOW (ref 150–400)
RBC: 3.22 MIL/uL — ABNORMAL LOW (ref 4.22–5.81)
RBC: 3.45 MIL/uL — ABNORMAL LOW (ref 4.22–5.81)
RDW: 12.2 % (ref 11.5–15.5)
RDW: 12.4 % (ref 11.5–15.5)
WBC: 5.3 10*3/uL (ref 4.0–10.5)
WBC: 6.5 10*3/uL (ref 4.0–10.5)

## 2011-10-05 LAB — BASIC METABOLIC PANEL
BUN: 148 mg/dL — ABNORMAL HIGH (ref 6–23)
CO2: 19 mEq/L (ref 19–32)
Calcium: 6.9 mg/dL — ABNORMAL LOW (ref 8.4–10.5)
Chloride: 98 mEq/L (ref 96–112)
Creatinine, Ser: 14.71 mg/dL — ABNORMAL HIGH (ref 0.50–1.35)
GFR calc Af Amer: 4 mL/min — ABNORMAL LOW (ref >90)
GFR calc non Af Amer: 4 mL/min — ABNORMAL LOW (ref >90)
Glucose, Bld: 106 mg/dL — ABNORMAL HIGH (ref 70–99)
Potassium: 4.7 mEq/L (ref 3.5–5.1)
Sodium: 138 mEq/L (ref 135–145)

## 2011-10-05 LAB — ALT: ALT: 13 U/L (ref 0–53)

## 2011-10-05 LAB — HEPATITIS B SURFACE ANTIBODY,QUALITATIVE: Hep B S Ab: NEGATIVE

## 2011-10-05 LAB — HEPATITIS C VRS RNA DETECT BY PCR-QUAL: Hepatitis C Vrs RNA by PCR-Qual: NEGATIVE

## 2011-10-05 LAB — RENAL FUNCTION PANEL
Albumin: 3.7 g/dL (ref 3.5–5.2)
BUN: 146 mg/dL — ABNORMAL HIGH (ref 6–23)
CO2: 18 mEq/L — ABNORMAL LOW (ref 19–32)
Calcium: 6.9 mg/dL — ABNORMAL LOW (ref 8.4–10.5)
Chloride: 99 mEq/L (ref 96–112)
Creatinine, Ser: 14.59 mg/dL — ABNORMAL HIGH (ref 0.50–1.35)
GFR calc Af Amer: 4 mL/min — ABNORMAL LOW (ref >90)
GFR calc non Af Amer: 4 mL/min — ABNORMAL LOW (ref >90)
Glucose, Bld: 101 mg/dL — ABNORMAL HIGH (ref 70–99)
Phosphorus: 7.4 mg/dL — ABNORMAL HIGH (ref 2.3–4.6)
Potassium: 4.6 mEq/L (ref 3.5–5.1)
Sodium: 139 mEq/L (ref 135–145)

## 2011-10-05 LAB — COMPLEMENT, TOTAL: Compl, Total (CH50): 59 U/mL (ref 31–60)

## 2011-10-05 LAB — PARATHYROID HORMONE, INTACT (NO CA): PTH: 598.8 pg/mL — ABNORMAL HIGH (ref 14.0–72.0)

## 2011-10-05 LAB — ANA: Anti Nuclear Antibody(ANA): NEGATIVE

## 2011-10-05 LAB — HEPATITIS B SURFACE ANTIGEN: Hepatitis B Surface Ag: NEGATIVE

## 2011-10-05 LAB — SURGICAL PCR SCREEN
MRSA, PCR: NEGATIVE
Staphylococcus aureus: POSITIVE — AB

## 2011-10-05 LAB — GLUCOSE, CAPILLARY: Glucose-Capillary: 93 mg/dL (ref 70–99)

## 2011-10-05 SURGERY — INSERTION OF DIALYSIS CATHETER
Anesthesia: Monitor Anesthesia Care | Site: Arm Lower

## 2011-10-05 MED ORDER — PARICALCITOL 5 MCG/ML IV SOLN
2.0000 ug | INTRAVENOUS | Status: DC
  Administered 2011-10-07 – 2011-10-09 (×2): 2 ug via INTRAVENOUS

## 2011-10-05 MED ORDER — ONDANSETRON HCL 4 MG/2ML IJ SOLN
4.0000 mg | Freq: Once | INTRAMUSCULAR | Status: AC | PRN
  Administered 2011-10-05: 4 mg via INTRAVENOUS

## 2011-10-05 MED ORDER — ONDANSETRON HCL 4 MG/2ML IJ SOLN
INTRAMUSCULAR | Status: AC
  Administered 2011-10-05: 4 mg via INTRAVENOUS
  Filled 2011-10-05: qty 2

## 2011-10-05 MED ORDER — HEPARIN SODIUM (PORCINE) 1000 UNIT/ML DIALYSIS
20.0000 [IU]/kg | INTRAMUSCULAR | Status: DC | PRN
  Filled 2011-10-05: qty 2

## 2011-10-05 MED ORDER — SODIUM CHLORIDE 0.9 % IV SOLN
INTRAVENOUS | Status: DC | PRN
  Administered 2011-10-05: 10:00:00 via INTRAVENOUS

## 2011-10-05 MED ORDER — CALCIUM ACETATE 667 MG PO CAPS
667.0000 mg | ORAL_CAPSULE | Freq: Three times a day (TID) | ORAL | Status: DC
  Administered 2011-10-05 – 2011-10-10 (×11): 667 mg via ORAL
  Filled 2011-10-05 (×17): qty 1

## 2011-10-05 MED ORDER — HEPARIN SODIUM (PORCINE) 1000 UNIT/ML IJ SOLN
INTRAMUSCULAR | Status: DC | PRN
  Administered 2011-10-05: 4.6 mL

## 2011-10-05 MED ORDER — MIDAZOLAM HCL 5 MG/5ML IJ SOLN
INTRAMUSCULAR | Status: DC | PRN
  Administered 2011-10-05 (×2): 1 mg via INTRAVENOUS

## 2011-10-05 MED ORDER — DARBEPOETIN ALFA-POLYSORBATE 60 MCG/0.3ML IJ SOLN: 60.0000 ug | INTRAMUSCULAR | Status: DC

## 2011-10-05 MED ORDER — RENA-VITE PO TABS
1.0000 | ORAL_TABLET | Freq: Every day | ORAL | Status: DC
  Administered 2011-10-05 – 2011-10-09 (×5): 1 via ORAL
  Filled 2011-10-05 (×6): qty 1

## 2011-10-05 MED ORDER — PROPOFOL INFUSION 10 MG/ML OPTIME
INTRAVENOUS | Status: DC | PRN
  Administered 2011-10-05: 25 ug/kg/min via INTRAVENOUS

## 2011-10-05 MED ORDER — MEPERIDINE HCL 25 MG/ML IJ SOLN: 6.2500 mg | INTRAMUSCULAR | Status: DC | PRN

## 2011-10-05 MED ORDER — 0.9 % SODIUM CHLORIDE (POUR BTL) OPTIME
TOPICAL | Status: DC | PRN
  Administered 2011-10-05: 1000 mL

## 2011-10-05 MED ORDER — HYDROMORPHONE HCL PF 1 MG/ML IJ SOLN
0.2500 mg | INTRAMUSCULAR | Status: DC | PRN
  Administered 2011-10-05: 0.5 mg via INTRAVENOUS

## 2011-10-05 MED ORDER — SODIUM CHLORIDE 0.9 % IR SOLN
Status: DC | PRN
  Administered 2011-10-05: 11:00:00

## 2011-10-05 MED ORDER — HYDROMORPHONE HCL PF 1 MG/ML IJ SOLN
INTRAMUSCULAR | Status: AC
  Filled 2011-10-05: qty 1

## 2011-10-05 MED ORDER — OXYCODONE HCL 5 MG/5ML PO SOLN: 5.0000 mg | Freq: Once | ORAL | Status: DC | PRN

## 2011-10-05 MED ORDER — LIDOCAINE-EPINEPHRINE 0.5 %-1:200000 IJ SOLN
INTRAMUSCULAR | Status: AC
  Filled 2011-10-05: qty 1

## 2011-10-05 MED ORDER — LIDOCAINE-EPINEPHRINE 0.5 %-1:200000 IJ SOLN
INTRAMUSCULAR | Status: DC | PRN
  Administered 2011-10-05: 20 mL

## 2011-10-05 MED ORDER — OXYCODONE HCL 5 MG PO TABS: 5.0000 mg | ORAL_TABLET | Freq: Once | ORAL | Status: DC | PRN

## 2011-10-05 MED ORDER — HEPARIN SODIUM (PORCINE) 1000 UNIT/ML IJ SOLN
INTRAMUSCULAR | Status: AC
  Filled 2011-10-05: qty 1

## 2011-10-05 MED ORDER — FENTANYL CITRATE 0.05 MG/ML IJ SOLN
INTRAMUSCULAR | Status: DC | PRN
  Administered 2011-10-05: 50 ug via INTRAVENOUS
  Administered 2011-10-05 (×2): 25 ug via INTRAVENOUS
  Administered 2011-10-05: 50 ug via INTRAVENOUS

## 2011-10-05 SURGICAL SUPPLY — 68 items
ADH SKN CLS APL DERMABOND .7 (GAUZE/BANDAGES/DRESSINGS)
APL SKNCLS STERI-STRIP NONHPOA (GAUZE/BANDAGES/DRESSINGS) ×2
BAG DECANTER FOR FLEXI CONT (MISCELLANEOUS) ×3 IMPLANT
BENZOIN TINCTURE PRP APPL 2/3 (GAUZE/BANDAGES/DRESSINGS) ×1 IMPLANT
CANISTER SUCTION 2500CC (MISCELLANEOUS) ×3 IMPLANT
CATH CANNON HEMO 15F 50CM (CATHETERS) IMPLANT
CATH CANNON HEMO 15FR 19 (HEMODIALYSIS SUPPLIES) IMPLANT
CATH CANNON HEMO 15FR 23CM (HEMODIALYSIS SUPPLIES) ×1 IMPLANT
CATH CANNON HEMO 15FR 31CM (HEMODIALYSIS SUPPLIES) IMPLANT
CATH CANNON HEMO 15FR 32CM (HEMODIALYSIS SUPPLIES) IMPLANT
CLIP LIGATING EXTRA MED SLVR (CLIP) ×2 IMPLANT
CLIP LIGATING EXTRA SM BLUE (MISCELLANEOUS) ×2 IMPLANT
CLIP TI MEDIUM 6 (CLIP) ×1 IMPLANT
CLIP TI WIDE RED SMALL 6 (CLIP) ×2 IMPLANT
CLOTH BEACON ORANGE TIMEOUT ST (SAFETY) ×3 IMPLANT
CLSR STERI-STRIP ANTIMIC 1/2X4 (GAUZE/BANDAGES/DRESSINGS) ×1 IMPLANT
COVER PROBE W GEL 5X96 (DRAPES) IMPLANT
COVER SURGICAL LIGHT HANDLE (MISCELLANEOUS) ×3 IMPLANT
DECANTER SPIKE VIAL GLASS SM (MISCELLANEOUS) ×3 IMPLANT
DERMABOND ADVANCED (GAUZE/BANDAGES/DRESSINGS)
DERMABOND ADVANCED .7 DNX12 (GAUZE/BANDAGES/DRESSINGS) IMPLANT
DRAIN PENROSE 1/2X12 LTX STRL (WOUND CARE) IMPLANT
DRAPE C-ARM 42X72 X-RAY (DRAPES) ×3 IMPLANT
DRAPE CHEST BREAST 15X10 FENES (DRAPES) ×3 IMPLANT
ELECT REM PT RETURN 9FT ADLT (ELECTROSURGICAL) ×3
ELECTRODE REM PT RTRN 9FT ADLT (ELECTROSURGICAL) ×2 IMPLANT
GAUZE SPONGE 2X2 8PLY STRL LF (GAUZE/BANDAGES/DRESSINGS) ×2 IMPLANT
GAUZE SPONGE 4X4 16PLY XRAY LF (GAUZE/BANDAGES/DRESSINGS) ×3 IMPLANT
GLOVE BIO SURGEON STRL SZ7 (GLOVE) ×2 IMPLANT
GLOVE BIOGEL M 6.5 STRL (GLOVE) ×2 IMPLANT
GLOVE BIOGEL PI IND STRL 6.5 (GLOVE) ×3 IMPLANT
GLOVE BIOGEL PI IND STRL 7.5 (GLOVE) ×3 IMPLANT
GLOVE BIOGEL PI INDICATOR 6.5 (GLOVE) ×3
GLOVE BIOGEL PI INDICATOR 7.5 (GLOVE) ×2
GLOVE ECLIPSE 6.5 STRL STRAW (GLOVE) ×8 IMPLANT
GOWN STRL NON-REIN LRG LVL3 (GOWN DISPOSABLE) ×9 IMPLANT
KIT BASIN OR (CUSTOM PROCEDURE TRAY) ×3 IMPLANT
KIT ROOM TURNOVER OR (KITS) ×3 IMPLANT
NDL 18GX1X1/2 (RX/OR ONLY) (NEEDLE) ×1 IMPLANT
NDL HYPO 25GX1X1/2 BEV (NEEDLE) ×2 IMPLANT
NEEDLE 18GX1X1/2 (RX/OR ONLY) (NEEDLE) ×3 IMPLANT
NEEDLE HYPO 25GX1X1/2 BEV (NEEDLE) ×3 IMPLANT
NS IRRIG 1000ML POUR BTL (IV SOLUTION) ×3 IMPLANT
PACK CV ACCESS (CUSTOM PROCEDURE TRAY) ×3 IMPLANT
PACK SURGICAL SETUP 50X90 (CUSTOM PROCEDURE TRAY) ×2 IMPLANT
PAD ARMBOARD 7.5X6 YLW CONV (MISCELLANEOUS) ×6 IMPLANT
SOAP 2 % CHG 4 OZ (WOUND CARE) ×3 IMPLANT
SPONGE GAUZE 2X2 STER 10/PKG (GAUZE/BANDAGES/DRESSINGS) ×1
SPONGE GAUZE 4X4 12PLY (GAUZE/BANDAGES/DRESSINGS) ×1 IMPLANT
SPONGE SURGIFOAM ABS GEL 100 (HEMOSTASIS) IMPLANT
STOPCOCK 4 WAY LG BORE MALE ST (IV SETS) ×2 IMPLANT
SUT ETHILON 3 0 PS 1 (SUTURE) ×3 IMPLANT
SUT MNCRL AB 4-0 PS2 18 (SUTURE) ×3 IMPLANT
SUT PROLENE 6 0 BV (SUTURE) ×1 IMPLANT
SUT PROLENE 7 0 BV 1 (SUTURE) ×1 IMPLANT
SUT VIC AB 3-0 SH 27 (SUTURE) ×3
SUT VIC AB 3-0 SH 27X BRD (SUTURE) ×2 IMPLANT
SYR 20CC LL (SYRINGE) ×6 IMPLANT
SYR 30ML LL (SYRINGE) IMPLANT
SYR 3ML LL SCALE MARK (SYRINGE) IMPLANT
SYR 5ML LL (SYRINGE) ×3 IMPLANT
SYR CONTROL 10ML LL (SYRINGE) ×3 IMPLANT
SYRINGE 10CC LL (SYRINGE) ×3 IMPLANT
TAPE CLOTH SURG 4X10 WHT LF (GAUZE/BANDAGES/DRESSINGS) ×1 IMPLANT
TOWEL OR 17X24 6PK STRL BLUE (TOWEL DISPOSABLE) ×3 IMPLANT
TOWEL OR 17X26 10 PK STRL BLUE (TOWEL DISPOSABLE) ×5 IMPLANT
UNDERPAD 30X30 INCONTINENT (UNDERPADS AND DIAPERS) ×3 IMPLANT
WATER STERILE IRR 1000ML POUR (IV SOLUTION) ×3 IMPLANT

## 2011-10-05 NOTE — Progress Notes (Signed)
Report given to stephanie rn as caregiver

## 2011-10-05 NOTE — Preoperative (Signed)
Beta Blockers   Reason not to administer Beta Blockers:Not Applicable 

## 2011-10-05 NOTE — Telephone Encounter (Addendum)
Message copied by Lujean Amel on Mon Oct 05, 2011  3:02 PM ------      Message from: [place], [place] K      Created: Mon Oct 05, 2011 12:49 PM      Regarding: schedule                   ----- Message -----         From: Gabriel Earing, PA         Sent: 10/05/2011  12:06 PM           To: Mena Goes, CMA            S/p left radio cephalic AVF by TFE.            F/u with him in 4-6 weeks.            Thanks,      Aldona Bar  I scheduled an appt for the above pt for 11/03/11 at 1:15pm. I left message for the pt and also mailed an appt letter. awt

## 2011-10-05 NOTE — OR Nursing (Signed)
First procedure insertion of dialysis catheter ended at 1104,Second procedure creation of left arm arteriovenous fistula started at 1124.

## 2011-10-05 NOTE — Anesthesia Postprocedure Evaluation (Signed)
Anesthesia Post Note  Patient: Cole [name]  Procedure(s) Performed: Procedure(s) (LRB): INSERTION OF DIALYSIS CATHETER (N/A) ARTERIOVENOUS (AV) FISTULA CREATION (Left)  Anesthesia type: MAC  Patient location: PACU  Post pain: Pain level controlled  Post assessment: Patient's Cardiovascular Status Stable  Last Vitals:  Filed Vitals:   10/05/11 1230  BP: 136/86  Pulse: 66  Temp:   Resp: 12    Post vital signs: Reviewed and stable  Level of consciousness: sedated  Complications: No apparent anesthesia complications  Anesthesia Post Note  Patient: [name]  Procedure(s) Performed: Procedure(s) (LRB): INSERTION OF DIALYSIS CATHETER (N/A) ARTERIOVENOUS (AV) FISTULA CREATION (Left)  Anesthesia type: MAC  Patient location: PACU  Post pain: Pain level controlled  Post assessment: Patient's Cardiovascular Status Stable  Last Vitals:  Filed Vitals:   10/05/11 1230  BP: 136/86  Pulse: 66  Temp:   Resp: 12    Post vital signs: Reviewed and stable  Level of consciousness: sedated  Complications: No apparent anesthesia complications

## 2011-10-05 NOTE — Progress Notes (Signed)
10/05/11 0800  An rn from hd came over to make sure that the patient understood everything that was happening with OR and HD.  The rn that was doing his HD treatment this afternoon helped answer questions and offer support for patient prior to OR.  A translator came into pacu to also assist patient with the pacu process and to get into hd.  Raymon Mutton rn

## 2011-10-05 NOTE — Progress Notes (Signed)
   CARE MANAGEMENT NOTE 10/05/2011  Patient:  Cole [name], Cole [name]   Account Number:  [id number]  Date Initiated:  10/05/2011  Documentation initiated by:  Jasmine Pang  Subjective/Objective Assessment:   ESRD with new started hemodialysis to begin 10/05/11, with without insurance or resources.     Action/Plan:   Finance notified of plan to begin hemodialysis for chronic renal failure and CM request initiation of Medicaid application process [id number].   Anticipated DC Date:  10/12/2011   Anticipated DC Plan:  HOME/SELF CARE         Choice offered to / List presented to:             Status of service:  In process, will continue to follow Medicare Important Message given?   (If response is "NO", the following Medicare IM given date fields will be blank) Date Medicare IM given:   Date Additional Medicare IM given:    Discharge Disposition:    Per UR Regulation:    If discussed at Long Length of Stay Meetings, dates discussed:    Comments:

## 2011-10-05 NOTE — Anesthesia Preprocedure Evaluation (Addendum)
Anesthesia Evaluation  Patient identified by MRN, date of birth, ID band Patient awake    Reviewed: Allergy & Precautions, H&P , NPO status , Patient's Chart, lab work & pertinent test results  Airway Mallampati: I TM Distance: >3 FB Neck ROM: Full    Dental   Pulmonary neg pulmonary ROS,          Cardiovascular hypertension, Pt. on medications     Neuro/Psych negative neurological ROS  negative psych ROS   GI/Hepatic negative GI ROS, Neg liver ROS,   Endo/Other  negative endocrine ROS  Renal/GU CRFRenal disease     Musculoskeletal negative musculoskeletal ROS (+)   Abdominal   Peds  Hematology negative hematology ROS (+)   Anesthesia Other Findings   Reproductive/Obstetrics                          Anesthesia Physical Anesthesia Plan  ASA: III  Anesthesia Plan: MAC   Post-op Pain Management:    Induction: Intravenous  Airway Management Planned: Simple Face Mask  Additional Equipment:   Intra-op Plan:   Post-operative Plan:   Informed Consent: I have reviewed the patients History and Physical, chart, labs and discussed the procedure including the risks, benefits and alternatives for the proposed anesthesia with the patient or authorized representative who has indicated his/her understanding and acceptance.   Dental advisory given  Plan Discussed with: Surgeon and CRNA  Anesthesia Plan Comments:        Anesthesia Quick Evaluation

## 2011-10-05 NOTE — H&P (View-Only) (Signed)
 [facility] R[facility]ING NOTE   Subjective:   Interval History: none, ate well, sleeps with difficulty. Plan for AVF and Perm cath in AM  Objective:  Vital signs in last 24 hours:  Temp:  [97.6 F (36.4 C)-98.4 F (36.9 C)] 98.4 F (36.9 C) (09/08 0950) Pulse Rate:  [77-88] 88  (09/08 0950) Resp:  [18-20] 18  (09/08 0950) BP: (129-146)/(85-90) 146/89 mmHg (09/08 0950) SpO2:  [99 %-100 %] 99 % (09/08 0950) Weight:  [62.052 kg (136 lb 12.8 oz)] 62.052 kg (136 lb 12.8 oz) (09/07 2050)  Weight change: 1.043 kg (2 lb 4.8 oz) Filed Weights   10/02/11 2052 10/03/11 2050  Weight: 61.009 kg (134 lb 8 oz) 62.052 kg (136 lb 12.8 oz)    Intake/Output: I/O last 3 completed shifts: In: 1320 [P.O.:1320] Out: 3076 [Urine:3075; Stool:1]   Intake/Output this shift:  Total I/O In: -  Out: 1175 [Urine:1175]  CVS- RRR RS- CTA ABD- BS present soft non-distended EXT- no edema   Basic Metabolic Panel:  Lab [id number] 0705 10/02/11 1441 10/02/11 1124  NA 137 -- 140  K 4.9 -- 5.0  CL 100 -- 99  CO2 21 -- 21  GLUCOSE 99 -- 124*  BUN 137* -- 134*  CREATININE 13.48* -- 12.72*  CALCIUM 6.7* -- 7.1*  MG -- 2.3 --  PHOS 5.4* 5.8* --    Liver Function Tests:  Lab 10/03/11 0705  AST --  ALT --  ALKPHOS --  BILITOT --  PROT --  ALBUMIN 3.3*   No results found for this basename: LIPASE:5,AMYLASE:5 in the last 168 hours No results found for this basename: AMMONIA:3 in the last 168 hours  CBC:  Lab 10/03/11 0705 10/02/11 1124  WBC 5.2 5.3  NEUTROABS -- 4.1  HGB 8.9* 10.5*  HCT 26.5* 30.8*  MCV 84.7 84.2  PLT 96* 104*    Cardiac Enzymes: No results found for this basename: CKTOTAL:5,CKMB:5,CKMBINDEX:5,TROPONINI:5 in the last 168 hours  BNP: No components found with this basename: POCBNP:5  CBG:  Lab 10/04/11 0739 10/03/11 0741  GLUCAP 96 93    Microbiology: No results found for this or any previous visit.  Coagulation Studies: No results found for  this basename: LABPROT:5,INR:5 in the last 72 hours  Urinalysis:  Basename 10/02/11 1257  COLORURINE YELLOW  LABSPEC 1.010  PHURINE 7.5  GLUCOSEU NEGATIVE  HGBUR LARGE*  BILIRUBINUR NEGATIVE  KETONESUR NEGATIVE  PROTEINUR >300*  UROBILINOGEN 0.2  NITRITE NEGATIVE  LEUKOCYTESUR NEGATIVE      Imaging: No results found.   Medications:        . amLODipine  5 mg Oral Daily  . cefUROXime (ZINACEF)  IV  1.5 g Intravenous On Call to OR  . darbepoetin (ARANESP) injection - DIALYSIS  60 mcg Subcutaneous Once  . simvastatin  20 mg Oral QPM  . sodium chloride  3 mL Intravenous Q12H  . DISCONTD: ceFAZolin  500 mg Intramuscular Q8H   HYDROcodone-acetaminophen  Assessment/ Plan:  Will be a new start to dialysis.   Will contact [facility] to place Catheter and Fistula.   Will need CLIP HTN controlled   Serologies  hep b neg and HIV neg PTH pending  Anemia Fe stores look OK start Aranesp  Will write for first treatment on dilaysis in AM, Social worker to see patient in AM for emergency medicaid application this week   LOS: 2 Cole [name] @TODAY @10 :01 AM

## 2011-10-05 NOTE — Op Note (Signed)
OPERATIVE REPORT  DATE OF SURGERY: 10/05/2011  PATIENT: Cole [name], 41 y.o. male MRN: [id number]  DOB: 04-Jan-1971  PRE-OPERATIVE DIAGNOSIS: End-stage renal disease  POST-OPERATIVE DIAGNOSIS:  Same  PROCEDURE: #1 left Cimino AV fistula creation, #2 right IJ hemodialysis catheter  SURGEON:  Curt Jews, M.D.  PHYSICIAN ASSISTANT: Rhyne  ANESTHESIA:  Local with sedation  EBL: Minimal ml  Total I/O In: 0  Out: 1055 [Urine:1050; Blood:5]  BLOOD ADMINISTERED: None  DRAINS: None  SPECIMEN: None  COUNTS CORRECT:  YES  PLAN OF CARE: PACU with chest x-ray pending   PATIENT DISPOSITION:  PACU - hemodynamically stable  PROCEDURE DETAILS: The patient was taken to the operating room where the right and left neck were imaged with ultrasound revealing widely patent jugular veins bilaterally. The right and left neck and chest were prepped and draped in the usual sterile fashion. The patient was placed in Trendelenburg position and using local anesthesia and a finder needle the right internal jugular vein was accessed. Next using an 18-gauge needle and the Seldinger technique a guidewire was passed up the level of the right atrium. A dilator and peel-away sheath were passed over the guidewire and the dilator and guidewire removed. The hemodialysis catheter was passed through the peel-away sheath and the peel-away sheath was removed. The catheter tips are positioned level of the distal right atrium. The catheters and brought through subcutaneous tunnel through a separate stab incision. A 2 lm ports were attached in both lumens flushed and aspirated easily and were locked with 1000 unit per cc heparin. The catheter was secured to the skin with a 3-0 nylon stitch and the entry site was closed with a 4-0 subcuticular Vicryl stitch.  Next the left arm was prepped and draped in usual sterile fashion. Ultrasound revealed patency of the cephalic vein throughout the forearm. There was a large  branch just above the wrist. Using local anesthesia the incision was made between the level of the radial artery and the cephalic vein. The vein was mobilized proximally and distally and tributary branches were ligated with 301 4-0 silk ties. The vein was ligated as distally with a 2-0 silk tie and was divided. This was brought into approximation with the radial artery. The radial artery was occluded proximally and distally with Serafin clamps. The artery was opened anteriorly with an 11 blade and extended longitudinally with Potts scissors. An appropriate length and was sewn end-to-side to the artery with a running 6-0 Prolene suture. Clamps removed and good thrill was noted. The wounds were irrigated with saline. The wound is closed with 3-0 Vicryl the subcutaneous and subcuticular tissue. Benzoin and Steri-Strips were applied. The patient was taken to the recovery room in stable condition   Curt Jews, M.D. 10/05/2011 12:21 PM

## 2011-10-05 NOTE — Transfer of Care (Signed)
Immediate Anesthesia Transfer of Care Note  Patient: Cole [name]  Procedure(s) Performed: Procedure(s) (LRB) with comments: INSERTION OF DIALYSIS CATHETER (N/A) - right internal jugular vein ARTERIOVENOUS (AV) FISTULA CREATION (Left)  Patient Location: PACU  Anesthesia Type: MAC  Level of Consciousness: awake, alert  and oriented  Airway & Oxygen Therapy: Patient Spontanous Breathing and Patient connected to nasal cannula oxygen  Post-op Assessment: Report given to PACU RN, Post -op Vital signs reviewed and stable and Patient moving all extremities  Post vital signs: Reviewed and stable  Complications: No apparent anesthesia complications

## 2011-10-05 NOTE — Interval H&P Note (Signed)
History and Physical Interval Note:  10/05/2011 10:25 AM  Cole [name]  has presented today for surgery, with the diagnosis of ESRD  The various methods of treatment have been discussed with the patient and family. After consideration of risks, benefits and other options for treatment, the patient has consented to  Procedure(s) (LRB) with comments: INSERTION OF DIALYSIS CATHETER (N/A) ARTERIOVENOUS (AV) FISTULA CREATION (Left) as a surgical intervention .  The patient's history has been reviewed, patient examined, no change in status, stable for surgery.  I have reviewed the patient's chart and labs.  Questions were answered to the patient's satisfaction.     Courtenay Creger

## 2011-10-05 NOTE — Procedures (Signed)
Patient was seen on dialysis and the procedure was supervised. BFR 150 Via right IJ PC BP is 159/93.  Patient appears to be tolerating treatment well but is complaining of some nausea post procedure.

## 2011-10-05 NOTE — Progress Notes (Signed)
Utilization review completed.  

## 2011-10-05 NOTE — Progress Notes (Signed)
10/05/11 Nursing Note:  Patient MRSA swab was negative for MRSA but positive for Staff aureus.  Dr. Jonnie Finner notified.  No orders given at this time.

## 2011-10-05 NOTE — Progress Notes (Signed)
Patient ID: Cole [name], male   DOB: Dec 25, 1970, 41 y.o.   MRN: [id number]  [facility] Progress Note    Subjective:   Nauseated post AVF/PC placement. Seen on HD   Objective:   BP 159/93  Pulse 78  Temp 97.8 F (36.6 C) (Oral)  Resp 15  Ht 5' 4.5" (1.638 m)  Wt 60.7 kg (133 lb 13.1 oz)  BMI 22.62 kg/m2  SpO2 100%  Physical Exam: Gen:WD WN hispanic male in mild distress CVS:RRR Resp:CTA [id number] Ext:no edema, left AVF +T/B  Labs: BMET  Lab 10/05/11 0635 10/03/11 0705 10/02/11 1441 10/02/11 1124  NA 138 137 -- 140  K 4.7 4.9 -- 5.0  CL 98 100 -- 99  CO2 19 21 -- 21  GLUCOSE 106* 99 -- 124*  BUN 148* 137* -- 134*  CREATININE 14.71* 13.48* -- 12.72*  ALBUMIN -- 3.3* -- --  CALCIUM 6.9* 6.7* -- 7.1*  PHOS -- 5.4* 5.8* --   CBC  Lab 10/05/11 0635 10/03/11 0705 10/02/11 1124  WBC 5.3 5.2 5.3  NEUTROABS -- -- 4.1  HGB 9.3* 8.9* 10.5*  HCT 26.5* 26.5* 30.8*  MCV 82.3 84.7 84.2  PLT 103* 96* 104*    @IMGRELPRIORS @ Medications:      . amLODipine  5 mg Oral Daily  . cefUROXime (ZINACEF)  IV  1.5 g Intravenous On Call to OR  . HYDROmorphone      . ondansetron      . simvastatin  20 mg Oral QPM  . sodium chloride  3 mL Intravenous Q12H     Assessment/ Plan:   1. Vascular access- s/p RIJ and left forearm AVF 2. ESRD- new start 3. Anemia:cont with ESA 4. Nutrition:stable 5. Hypertension:stable 6. Hypocalcemia- add calcium carbonate and vitamin D 7. Dispo- awaiting CLIP and emergency medicare/medicaid  Cole [name] A 10/05/2011, 2:56 PM

## 2011-10-06 ENCOUNTER — Inpatient Hospital Stay (HOSPITAL_COMMUNITY): Payer: Medicaid Other

## 2011-10-06 ENCOUNTER — Encounter (HOSPITAL_COMMUNITY): Payer: Self-pay | Admitting: Vascular Surgery

## 2011-10-06 LAB — RENAL FUNCTION PANEL
Albumin: 3.6 g/dL (ref 3.5–5.2)
BUN: 100 mg/dL — ABNORMAL HIGH (ref 6–23)
CO2: 23 mEq/L (ref 19–32)
Calcium: 7.6 mg/dL — ABNORMAL LOW (ref 8.4–10.5)
Chloride: 100 mEq/L (ref 96–112)
Creatinine, Ser: 11.52 mg/dL — ABNORMAL HIGH (ref 0.50–1.35)
GFR calc Af Amer: 6 mL/min — ABNORMAL LOW (ref >90)
GFR calc non Af Amer: 5 mL/min — ABNORMAL LOW (ref >90)
Glucose, Bld: 108 mg/dL — ABNORMAL HIGH (ref 70–99)
Phosphorus: 6.3 mg/dL — ABNORMAL HIGH (ref 2.3–4.6)
Potassium: 4.7 mEq/L (ref 3.5–5.1)
Sodium: 139 mEq/L (ref 135–145)

## 2011-10-06 LAB — PROTEIN ELECTROPHORESIS, SERUM
Albumin ELP: 60.4 % (ref 55.8–66.1)
Alpha-1-Globulin: 5.1 % — ABNORMAL HIGH (ref 2.9–4.9)
Alpha-2-Globulin: 12.7 % — ABNORMAL HIGH (ref 7.1–11.8)
Beta 2: 4.5 % (ref 3.2–6.5)
Beta Globulin: 5.3 % (ref 4.7–7.2)
Gamma Globulin: 12 % (ref 11.1–18.8)
M-Spike, %: NOT DETECTED g/dL
Total Protein ELP: 3.9 g/dL — ABNORMAL LOW (ref 6.0–8.3)

## 2011-10-06 LAB — CBC
HCT: 29.8 % — ABNORMAL LOW (ref 39.0–52.0)
Hemoglobin: 10.2 g/dL — ABNORMAL LOW (ref 13.0–17.0)
MCH: 28.5 pg (ref 26.0–34.0)
MCHC: 34.2 g/dL (ref 30.0–36.0)
MCV: 83.2 fL (ref 78.0–100.0)
Platelets: 57 10*3/uL — ABNORMAL LOW (ref 150–400)
RBC: 3.58 MIL/uL — ABNORMAL LOW (ref 4.22–5.81)
RDW: 12.5 % (ref 11.5–15.5)
WBC: 8.6 10*3/uL (ref 4.0–10.5)

## 2011-10-06 LAB — IMMUNOFIXATION ELECTROPHORESIS
IgA: 138 mg/dL (ref 68–379)
IgG (Immunoglobin G), Serum: 795 mg/dL (ref 650–1600)
IgM, Serum: 45 mg/dL — ABNORMAL LOW (ref 41–251)
Total Protein ELP: 5 g/dL — ABNORMAL LOW (ref 6.0–8.3)

## 2011-10-06 LAB — GLUCOSE, CAPILLARY: Glucose-Capillary: 112 mg/dL — ABNORMAL HIGH (ref 70–99)

## 2011-10-06 LAB — HEPATITIS B CORE ANTIBODY, TOTAL: Hep B Core Total Ab: NEGATIVE

## 2011-10-06 MED ORDER — PARICALCITOL 5 MCG/ML IV SOLN
INTRAVENOUS | Status: AC
  Filled 2011-10-06: qty 1

## 2011-10-06 MED ORDER — HEPARIN SODIUM (PORCINE) 1000 UNIT/ML DIALYSIS
20.0000 [IU]/kg | INTRAMUSCULAR | Status: DC | PRN
  Administered 2011-10-07: 1200 [IU] via INTRAVENOUS_CENTRAL
  Filled 2011-10-06: qty 2

## 2011-10-06 MED ORDER — ONDANSETRON HCL 4 MG/2ML IJ SOLN
4.0000 mg | Freq: Once | INTRAMUSCULAR | Status: AC
  Administered 2011-10-06: 4 mg via INTRAVENOUS
  Filled 2011-10-06: qty 2

## 2011-10-06 NOTE — Progress Notes (Addendum)
[facility] [place] Postoperative Visit hemodialysis access   Date of Surgery:  10/05/11  Surgeon: Early HD:  yes   CC: f/u for left RC AVF placement and diatek placement .  PHYSICAL EXAMINATION:  Filed Vitals:   10/06/11 0519  BP: 146/83  Pulse: 81  Temp: 98.3 F (36.8 C)  Resp: 18     Incision is c/d/i with some bloody drainage at the distal portion of incision Hand grip is equal and sensation in digits is intact; There is  Thrill; there is bruit. The graft/fistula is easily palpable  Pulse:    -pt doing well.  AVF has good thrill and bruit and pt does not complain of steal symptoms. -do not use AVF for 3 months. -f/u with Dr. Donnetta Hutching in 4-6 weeks. -call if needed.  Leontine Locket, PA-C [facility] [phone]

## 2011-10-06 NOTE — Procedures (Signed)
Second hemodialysis treatment:  Goal 1-2L.  Net UF 1.8L, but patient experienced leg cramps and one episode of intradialytic hypotension 71/36 relieved with discontinuation of UF and NS bolus.  Post weight (standing) 57.0kg.  Recommend higher EDW. Thanks!

## 2011-10-06 NOTE — Progress Notes (Signed)
INITIAL ADULT NUTRITION ASSESSMENT Date: 10/06/2011   Time: 10:33 AM  Reason for Assessment: Malnutrition Screening and Education for New HD Patient  INTERVENTION: 1. Provided Spanish Choose-A-Meal booklet and reviewed information with patient via in-person interpreter. 2. Suspect pt to continue to eat well once n/v under control; will not add supplements at this time. If intake does not improve, consider adding Breeze daily 3. RD to continue to follow nutrition care plan  DOCUMENTATION CODES Per approved criteria  -Not Applicable   ASSESSMENT: Male 41 y.o.  Dx: ARF  Hx:  Past Medical History  Diagnosis Date  . Hypertension   . Chronic kidney disease    Past Surgical History  Procedure Date  . No past surgeries    Related Meds:     . amLODipine  5 mg Oral Daily  . calcium acetate  667 mg Oral TID WC  . darbepoetin (ARANESP) injection - DIALYSIS  60 mcg Intravenous Q Mon-HD  . HYDROmorphone      . multivitamin  1 tablet Oral QHS  . ondansetron  4 mg Intravenous Once  . paricalcitol  2 mcg Intravenous Q M,W,F-HD  . simvastatin  20 mg Oral QPM  . sodium chloride  3 mL Intravenous Q12H   Ht: 5' 4.5" (163.8 cm)  Wt: 133 lb 9.6 oz (60.6 kg) s/p HD on 9/9  Ideal Wt: 61.8 kg % Ideal Wt: 98%  Usual Wt: pt unable to state % Usual Wt: n/a  Body mass index is 22.58 kg/(m^2). WNL  Food/Nutrition Related Hx: good appetite PTA; works in [employer]:  CMP     Component Value Date/Time   NA 139 10/06/2011 0547   K 4.7 10/06/2011 0547   CL 100 10/06/2011 0547   CO2 23 10/06/2011 0547   GLUCOSE 108* 10/06/2011 0547   BUN 100* 10/06/2011 0547   CREATININE 11.52* 10/06/2011 0547   CALCIUM 7.6* 10/06/2011 0547   ALBUMIN 3.6 10/06/2011 0547   ALT 13 10/05/2011 1439   GFRNONAA 5* 10/06/2011 0547   GFRAA 6* 10/06/2011 0547   Phosphorus  Date/Time Value Range Status  10/06/2011  5:47 AM 6.3* 2.3 - 4.6 mg/dL Final    Intake/Output Summary (Last 24 hours) at 10/06/11  1037 Last data filed at 10/06/11 0520  Gross per 24 hour  Intake    970 ml  Output    560 ml  Net    410 ml   Diet Order: Renal 80-90  Supplements/Tube Feeding: Nepro PRN; rena-vit  IVF:    Estimated Nutritional Needs:   Kcal: 1800 - 2000 kcal Protein:  72- 85 grams Fluid: 1.2 liters daily  Pt with hx of uncontrolled HTN. Admitted with lower extremity swelling. Pt has started on dialysis and will need CLIP process for outpatient HD.  RD spoke with patient via in-person interpreter, Antonio. Per patient, he has had a good appetite PTA with no known weight changes. States he does not keep up with his weight but he does not suspect that he has had any weight loss. Pt has been eating well this admission, however he has had 3 episodes of vomiting this morning. RN aware and provided pt with zofran.  Provided Choose-A-Meal Booklet to patient. Reviewed food groups and provided written recommended serving sizes specifically determined for patient's current nutritional status.   Explained why diet restrictions are needed and provided lists of foods to limit/avoid that are high potassium, sodium, and phosphorus. Provided specific recommendations on safer alternatives of these foods. Strongly  encouraged compliance of this diet.   Discussed importance of protein intake at each meal and snack. Provided examples of how to maximize protein intake throughout the day. Discussed need for fluid restriction with dialysis, importance of minimizing weight gain between HD treatments, and renal-friendly beverage options.  Encouraged pt to discuss specific diet questions/concerns with RD at HD outpatient facility.   NUTRITION DIAGNOSIS: 1. Food- and nutrition-related knowledge deficit r/t limited prior diet education AEB pt report and new to HD. 2. Inadequate oral intake r/t GI distress AEB pt report.  MONITORING/EVALUATION(Goals): Goal: Pt to meet >/= 90% of their estimated nutrition needs Monitor: weight  trends, lab trends, I/O's, PO intake, supplement tolerance  EDUCATION NEEDS: -Education needs addressed  Inda Coke MS, RD, LDN Pager: [phone] After-hours pager: [phone]

## 2011-10-06 NOTE — Progress Notes (Signed)
Patient ID: Cole [name], male   DOB: 1970-03-06, 41 y.o.   MRN: [id number]  [facility] Progress Note    Subjective:   Feels better   Objective:   BP 133/90  Pulse 91  Temp 98.1 F (36.7 C) (Oral)  Resp 18  Ht 5' 4.5" (1.638 m)  Wt 60.6 kg (133 lb 9.6 oz)  BMI 22.58 kg/m2  SpO2 99%  Physical Exam: Gen:WD WN male in NAD CVS:RRR Resp:CTA [id number] Ext:+bleeding from AVF site, +T/B  Labs: BMET  Lab 10/06/11 0547 10/05/11 1439 10/05/11 0635 10/03/11 0705 10/02/11 1441 10/02/11 1124  NA 139 139 138 137 -- 140  K 4.7 4.6 4.7 4.9 -- 5.0  CL 100 99 98 100 -- 99  CO2 23 18* 19 21 -- 21  GLUCOSE 108* 101* 106* 99 -- 124*  BUN 100* 146* 148* 137* -- 134*  CREATININE 11.52* 14.59* 14.71* 13.48* -- 12.72*  ALBUMIN 3.6 3.7 -- 3.3* -- --  CALCIUM 7.6* 6.9* 6.9* 6.7* -- 7.1*  PHOS 6.3* 7.4* -- 5.4* 5.8* --   CBC  Lab 10/06/11 0547 10/05/11 1439 10/05/11 0635 10/03/11 0705 10/02/11 1124  WBC 8.6 6.5 5.3 5.2 --  NEUTROABS -- -- -- -- 4.1  HGB 10.2* 9.9* 9.3* 8.9* --  HCT 29.8* 28.5* 26.5* 26.5* --  MCV 83.2 82.6 82.3 84.7 --  PLT 57* 100* 103* 96* --    @IMGRELPRIORS @ Medications:      . amLODipine  5 mg Oral Daily  . calcium acetate  667 mg Oral TID WC  . darbepoetin (ARANESP) injection - DIALYSIS  60 mcg Intravenous Q Mon-HD  . HYDROmorphone      . multivitamin  1 tablet Oral QHS  . ondansetron  4 mg Intravenous Once  . paricalcitol  2 mcg Intravenous Q M,W,F-HD  . simvastatin  20 mg Oral QPM  . sodium chloride  3 mL Intravenous Q12H     Assessment/ Plan:   1. Vascular access- s/p RIJ and left forearm AVF, now bleeding, will call [facility]VS to evaluate 2. ESRD- new start 3. Nausea- improved post zofran and HD 4. Anemia:cont with ESA 5. Nutrition:stable 6. Hypertension:stable 7. Hypocalcemia- add calcium carbonate and vitamin D 8. Dispo- awaiting CLIP and emergency medicare/medicaid  Hartwell Vandiver A 10/06/2011, 1:22 PM

## 2011-10-06 NOTE — Procedures (Signed)
Patient was seen on dialysis and the procedure was supervised. BFR 250 Via PC BP is 155/91.  Patient appears to be tolerating treatment well

## 2011-10-07 ENCOUNTER — Inpatient Hospital Stay (HOSPITAL_COMMUNITY): Payer: Medicaid Other

## 2011-10-07 LAB — GLUCOSE, CAPILLARY: Glucose-Capillary: 114 mg/dL — ABNORMAL HIGH (ref 70–99)

## 2011-10-07 MED ORDER — PARICALCITOL 5 MCG/ML IV SOLN
INTRAVENOUS | Status: AC
  Administered 2011-10-07: 2 ug via INTRAVENOUS
  Filled 2011-10-07: qty 1

## 2011-10-07 NOTE — Progress Notes (Signed)
   CARE MANAGEMENT NOTE 10/07/2011  Patient:  Cole [name], Cole [name]   Account Number:  [id number]  Date Initiated:  10/05/2011  Documentation initiated by:  Jasmine Pang  Subjective/Objective Assessment:   ESRD with new started hemodialysis to begin 10/05/11, with without insurance or resources.     Action/Plan:   Finance notified of plan to begin hemodialysis for chronic renal failure and CM request initiation of Medicaid application process [id number].   Anticipated DC Date:  10/12/2011   Anticipated DC Plan:  HOME/SELF CARE         Choice offered to / List presented to:             Status of service:  In process, will continue to follow Medicare Important Message given?   (If response is "NO", the following Medicare IM given date fields will be blank) Date Medicare IM given:   Date Additional Medicare IM given:    Discharge Disposition:    Per UR Regulation:    If discussed at Long Length of Stay Meetings, dates discussed:    Comments:  10/07/2011  [place], [place] Met with patient to discuss discharge planning. He lives with his family, brother. Transportation he has a car to get to appointments. Medicaid application completed [id number],  awaiting status. HD: #1 10/05/2011

## 2011-10-07 NOTE — Procedures (Signed)
Patient was seen on dialysis and the procedure was supervised. BFR 300 Via RIJ PC BP is 106/76.  Patient appears to be tolerating treatment well.  Still awaiting emergency medicare and outpt HD

## 2011-10-08 MED ORDER — PROMETHAZINE HCL 25 MG RE SUPP: 25.0000 mg | Freq: Four times a day (QID) | RECTAL | Status: DC | PRN

## 2011-10-08 MED ORDER — HEPARIN SODIUM (PORCINE) 1000 UNIT/ML DIALYSIS
20.0000 [IU]/kg | INTRAMUSCULAR | Status: DC | PRN
  Administered 2011-10-09: 1200 [IU] via INTRAVENOUS_CENTRAL
  Filled 2011-10-08: qty 2

## 2011-10-08 MED ORDER — PROMETHAZINE HCL 25 MG/ML IJ SOLN: 12.5000 mg | Freq: Four times a day (QID) | INTRAMUSCULAR | Status: DC | PRN

## 2011-10-08 MED ORDER — PROMETHAZINE HCL 25 MG PO TABS: 25.0000 mg | ORAL_TABLET | Freq: Four times a day (QID) | ORAL | Status: DC | PRN

## 2011-10-08 NOTE — Progress Notes (Addendum)
10/08/2011 9:41 AM Hemodialysis Outpatient Note; This patient has been accepted at the [facility] dialysis center on a TTS 2nd shift schedule. The center can begin treatment on Tues 09.17.13 at [facility]. This patient has a pending Medicaid # [id number] S .  Thank you.  Gordy Savers  10/09/2011 9:28 AM Addendum to above note-This patient's schedule has been changed to the [facility] location to begin on Tues 09.17.13 at 1115 and continue on that schedule until Monday 09.30.13 at which time he will transition to the MWF 4pm schedule. Patient has been made aware of this change and it is per his request for the afternoon(3rd) shift.  Thank you Gordy Savers

## 2011-10-08 NOTE — Progress Notes (Signed)
Patient ID: Cole [name], male   DOB: Sep 24, 1970, 40 y.o.   MRN: [id number]  [facility] Progress Note    Subjective:   No new complaints   Objective:   BP 121/71  Pulse 76  Temp 98.3 F (36.8 C) (Oral)  Resp 18  Ht 5' 4.5" (1.638 m)  Wt 57.924 kg (127 lb 11.2 oz)  BMI 21.58 kg/m2  SpO2 97%  Physical Exam: Gen:WD WN HM in NAD CVS:RRR Resp:CTA [id number] Ext:no edema, R cimino AVF +T/B, some dried blood  Labs: BMET  Lab 10/06/11 0547 10/05/11 1439 10/05/11 0635 10/03/11 0705 10/02/11 1441 10/02/11 1124  NA 139 139 138 137 -- 140  K 4.7 4.6 4.7 4.9 -- 5.0  CL 100 99 98 100 -- 99  CO2 23 18* 19 21 -- 21  GLUCOSE 108* 101* 106* 99 -- 124*  BUN 100* 146* 148* 137* -- 134*  CREATININE 11.52* 14.59* 14.71* 13.48* -- 12.72*  ALBUMIN 3.6 3.7 -- 3.3* -- --  CALCIUM 7.6* 6.9* 6.9* 6.7* -- 7.1*  PHOS 6.3* 7.4* -- 5.4* 5.8* --   CBC  Lab 10/06/11 0547 10/05/11 1439 10/05/11 0635 10/03/11 0705 10/02/11 1124  WBC 8.6 6.5 5.3 5.2 --  NEUTROABS -- -- -- -- 4.1  HGB 10.2* 9.9* 9.3* 8.9* --  HCT 29.8* 28.5* 26.5* 26.5* --  MCV 83.2 82.6 82.3 84.7 --  PLT 57* 100* 103* 96* --    @IMGRELPRIORS @ Medications:      . amLODipine  5 mg Oral Daily  . calcium acetate  667 mg Oral TID WC  . darbepoetin (ARANESP) injection - DIALYSIS  60 mcg Intravenous Q Mon-HD  . multivitamin  1 tablet Oral QHS  . paricalcitol  2 mcg Intravenous Q M,W,F-HD  . simvastatin  20 mg Oral QPM  . sodium chloride  3 mL Intravenous Q12H     Assessment/ Plan:   1. Vascular access- s/p RIJ and left forearm AVF, now bleeding, will call VVS to evaluate 2. ESRD- new start.  TTS at [facility] once cleared by [facility] office 3. Nausea- improved post zofran and HD.  Will start phenergan 4. Anemia:cont with ESA 5. Nutrition:stable 6. Hypertension:stable 7. Hypocalcemia- add calcium carbonate and vitamin D 8. Dispo- awaiting CLIP approval as he has [name] pending emergency  medicaid  Cole [name] 10/08/2011, 8:30 AM

## 2011-10-08 NOTE — Progress Notes (Signed)
VASCULAR SURGERY  I was asked to evaluate this patient because of some bleeding from the left wrist. This patient had placement of a left radiocephalic AV fistula by Dr. Donnetta Hutching on 10/05/2011. Some bloody drainage was noted that the incision on postoperative day #1. Apparently he has continued to have some oozing from the distal aspect of the left wrist incision. I removed the Steri-Strips over the area of concern. There was a small skin edge bleeder. I applied benzoin and Steri-Strip to apply gentle pressure to this area and then covered this with a dry dressing. This appeared to achieve hemostasis. If this continues to lose consider using a silver nitrate stick to cauterize the small skin edge bleeder.  Judeth Cornfield. Scot Dock, [place], [place] [phone] 10/08/2011

## 2011-10-08 NOTE — Progress Notes (Addendum)
As per Dr. Marval Regal request,Vascular surgery was call to come and assess the incision on left lower arm.MD. On call is in the OR. At the [facility] in OR answered the pager and will notify the vascular surgeon.keep monitoring pt. Closely.

## 2011-10-09 ENCOUNTER — Inpatient Hospital Stay (HOSPITAL_COMMUNITY): Payer: Medicaid Other

## 2011-10-09 LAB — CBC
HCT: 27.8 % — ABNORMAL LOW (ref 39.0–52.0)
Hemoglobin: 9.4 g/dL — ABNORMAL LOW (ref 13.0–17.0)
MCH: 28.7 pg (ref 26.0–34.0)
MCHC: 33.8 g/dL (ref 30.0–36.0)
MCV: 85 fL (ref 78.0–100.0)
Platelets: 68 10*3/uL — ABNORMAL LOW (ref 150–400)
RBC: 3.27 MIL/uL — ABNORMAL LOW (ref 4.22–5.81)
RDW: 12.7 % (ref 11.5–15.5)
WBC: 8 10*3/uL (ref 4.0–10.5)

## 2011-10-09 LAB — RENAL FUNCTION PANEL
Albumin: 3.5 g/dL (ref 3.5–5.2)
BUN: 75 mg/dL — ABNORMAL HIGH (ref 6–23)
CO2: 23 mEq/L (ref 19–32)
Calcium: 7.7 mg/dL — ABNORMAL LOW (ref 8.4–10.5)
Chloride: 93 mEq/L — ABNORMAL LOW (ref 96–112)
Creatinine, Ser: 9.48 mg/dL — ABNORMAL HIGH (ref 0.50–1.35)
GFR calc Af Amer: 7 mL/min — ABNORMAL LOW (ref >90)
GFR calc non Af Amer: 6 mL/min — ABNORMAL LOW (ref >90)
Glucose, Bld: 103 mg/dL — ABNORMAL HIGH (ref 70–99)
Phosphorus: 6.4 mg/dL — ABNORMAL HIGH (ref 2.3–4.6)
Potassium: 3.8 mEq/L (ref 3.5–5.1)
Sodium: 135 mEq/L (ref 135–145)

## 2011-10-09 MED ORDER — PARICALCITOL 5 MCG/ML IV SOLN
INTRAVENOUS | Status: AC
  Administered 2011-10-09: 2 ug via INTRAVENOUS
  Filled 2011-10-09: qty 1

## 2011-10-09 NOTE — Progress Notes (Signed)
Utilization review completed.  

## 2011-10-09 NOTE — Procedures (Signed)
Patient was seen on dialysis and the procedure was supervised. BFR 400 Via RIJ PC BP is 125/75.  Patient appears to be tolerating treatment well except he dropped his BP so increased EDW.  A/P 1. Vascular access- s/p RIJ and left forearm AVF, stopped bleeding.  Appreciate VVS input. 2. ESRD- new start 3. Nausea- improved post zofran and HD 4. Anemia:cont with ESA 5. Nutrition:stable 6. Hypertension:stable 7. Hypocalcemia- add calcium carbonate and vitamin D 8. Dispo- awaiting CLIP and emergency medicare/medicaid.  Also has a job in [employer] and needs an evening slot for HD.  Working on arranging outpt HD 9.

## 2011-10-10 MED ORDER — CALCIUM ACETATE 667 MG PO CAPS: 667.0000 mg | ORAL_CAPSULE | Freq: Three times a day (TID) | ORAL | Status: AC

## 2011-10-10 MED ORDER — PARICALCITOL 5 MCG/ML IV SOLN: 5.0000 ug | INTRAVENOUS | Status: DC

## 2011-10-10 MED ORDER — RENA-VITE PO TABS: 1.0000 | ORAL_TABLET | Freq: Every day | ORAL | Status: AC

## 2011-10-10 NOTE — Progress Notes (Signed)
Pt. Got discharge instructions and prescriptions,out patient Hemodialysis has been schedule as well,pt. Ready to go home with his family.All the instructions were given in spanish by Rayetta Humphrey RN.

## 2011-10-10 NOTE — Discharge Summary (Signed)
Physician Discharge Summary  Patient ID: Cole [name] MRN: [id number] DOB/AGE: 41-04-1970 41 y.o.  Admit date: 10/02/2011 Discharge date: 10/10/2011  Consults: Dr Sherren Mocha Early   [facility]  Significant Diagnostic Studies:  10/05/11 0635  10/03/11 0705  10/02/11 1441  10/02/11 1124   NA  138  137  --  140   K  4.7  4.9  --  5.0   CL  98  100  --  99   CO2  19  21  --  21   GLUCOSE  106*  99  --  124*   BUN  148*  137*  --  134*   CREATININE  14.71*  13.48*  --  12.72*   ALBUMIN  --  3.3*  --  --   CALCIUM  6.9*  6.7*  --  7.1*   PHOS  --  5.4*  5.8*  --    CBC   Lab  10/05/11 0635  10/03/11 0705  10/02/11 1124   WBC  5.3  5.2  5.3   NEUTROABS  --  --  4.1   HGB  9.3*  8.9*  10.5*   HCT  26.5*  26.5*  30.8*   MCV  82.3  84.7  84.2   PLT  103*  96*  104*   Results for Cole [name], Cole [name] (MRN [id number]) as of 10/10/2011 09:09  Ref. Range 10/02/2011 21:31  PTH Latest Range: 14.0-72.0 pg/mL 598.8 (H)  Results for Cole [name], Cole [name] (MRN [id number]) as of 10/10/2011 09:09  Ref. Range 10/02/2011 14:41  TSH Latest Range: 0.350-4.500 uIU/mL 1.501  Results for Cole [name], Cole [name] (MRN [id number]) as of 10/10/2011 09:09  Ref. Range 10/02/2011 14:41 10/02/2011 21:31  Compl, Total (CH50) Latest Range: 31-60 U/mL 59   IgG (Immunoglobin G), Serum Latest Range: 925-331-3002 mg/dL  795  IgA Latest Range: 68-379 mg/dL  138  Alpha-1-Globulin Latest Range: 2.9-4.9 %  5.1 (H)  Alpha-2-Globulin Latest Range: 7.1-11.8 %  12.7 (H)   Results for Cole [name], Cole [name] (MRN [id number]) as of 10/10/2011 09:09  Ref. Range 10/02/2011 12:57 10/03/2011 05:09  Color, Urine Latest Range: YELLOW  YELLOW   APPearance Latest Range: CLEAR  CLEAR   Specific Gravity, Urine Latest Range: 1.005-1.030  1.010   pH Latest Range: 5.0-8.0  7.5   Glucose Latest Range: NEGATIVE mg/dL NEGATIVE   Bilirubin Urine Latest Range: NEGATIVE  NEGATIVE   Ketones, ur Latest Range: NEGATIVE mg/dL NEGATIVE   Protein Latest Range: NEGATIVE  mg/dL >300 (A)   Urobilinogen, UA Latest Range: 0.0-1.0 mg/dL 0.2   Nitrite Latest Range: NEGATIVE  NEGATIVE   Leukocytes, UA Latest Range: NEGATIVE  NEGATIVE   Hgb urine dipstick Latest Range: NEGATIVE  LARGE (A)   WBC, UA Latest Range: <3 WBC/hpf 0-2   RBC / HPF Latest Range: <3 RBC/hpf 21-50   Bacteria, UA Latest Range: RARE  RARE   Total Protein, Urine No range found  229.8  PROTEIN CREATININE RATIO Latest Range: 0.00-0.15   4.95 (H)  Creatinine, Urine No range found  46.45              PACS Images     Show images for DG CHEST PORT 1 VIEW         Study Result     *RADIOLOGY REPORT*  Clinical Data: Dialysis catheter placement.  PORTABLE CHEST - 1 VIEW 10/05/2011 1325 hours:  Comparison: None.  Findings: Right jugular dialysis catheter tips overlie the  cavoatrial junction and upper right atrium. No evidence  of  pneumothorax or mediastinal hematoma. Cardiac silhouette mildly  enlarged for technique. Pulmonary vascularity normal. Lungs  clear. Bronchovascular markings normal. Pulmonary vascularity  normal. No pneumothorax. No pleural effusions.  IMPRESSION:  Right jugular dialysis catheter tips overlie the cavoatrial  junction and upper right atrium. No acute complicating features.  No acute cardiopulmonary disease.  Original Report Authenticated By: Deniece Portela, M.D.     Treatments:  Hemodialysis  /   10/05/11 Insertion of Left Forearm AVF and Right IJ Perm Cath  Dr. Donnetta Hutching   Discharge Diagnoses: 1. ESRD Secondary to Presumed Hypertensive Disease with New start Hemodialysis                                          2. Hypertension                                           3. Anemia Chronic Disease                                           4. Secondary Hyperparathyroidism     Hospital Course:   Pt is 41 yo  Hispanic male  with history of uncontrolled hypertension who presents from [place] office per Dr. Florene Glen for further evaluation of new onset renal failure.  He reports noticing lower extremity swelling of 4 days duration and intermittent episodes of dizziness with blurry vision. Also decreased urine ouput. He denies any chest pain or shortness of breath, no abdominal or specific urinary concerns, no chest pain, no cough, no fevers, no chills, no other systemic symptoms.. After work up per Dr. Justin Mend showing no reversal of kidney function  Dr. Justin Mend started pt. On Hemodialysis with  Noted labs showing Cr. 13.48/ Bun 137 with k 4.9 and co2= 21. Ca  6.7 with phos 5.4 and Alb 3.3, hgb 8.9.       Dr. Justin Mend consulted VVS for perm cath and AVF placement.   The day of discharge pt. Reported no uremic symptoms and pedal edema resolved.  He will have hemodialysis at [place] , Thursday ,Saturday schedule and plans to go to [facility] when he returns to work Sept. 30, 2013.Blood pressure was controlled with Hemodialysis and his low dose Amlodipine 5mg   q hs. He was  cleared for discharge home by Dr. Marval Regal.  Diet:Renal failure    Discharge Medications:    Medication List     As of 10/10/2011  9:02 AM    STOP taking these medications         calcium-vitamin D 500-200 MG-UNIT per tablet   Commonly known as: OSCAL WITH D      furosemide 20 MG tablet   Commonly known as: LASIX      TAKE these medications         amLODipine 5 MG tablet   Commonly known as: NORVASC   Take 5 mg by mouth daily.      calcium acetate 667 MG capsule   Commonly known as: PHOSLO   Take 1 capsule (667 mg total) by mouth 3 (three) times daily with meals.      multivitamin Tabs tablet  Take 1 tablet by mouth at bedtime.      paricalcitol 5 MCG/ML injection   Commonly known as: ZEMPLAR   Inject 1 mL (5 mcg total) into the vein every Monday, Wednesday, and Friday with hemodialysis.      simvastatin 20 MG tablet   Commonly known as: ZOCOR   Take 20 mg by mouth every evening.       epogen 5,000 units  q hemodialysis  Disposition:to home     Discharge  Orders    Future Appointments: Provider: Department: Dept Phone: Center:   11/03/2011 1:15 PM Rosetta Posner, MD [facility] [phone] [facility]     Future Orders Please Complete By Expires   Renal diet - Limit fluids to 1200 ml/day      Continue to follow the Renal Care Notes you received during your hospital stay      Bring all medications to your doctor's appointment      Scheduling Instructions:   Go to [place] 10/13/11 for dialysis at 9 am   Do not skip any hemodialysis appointments unless directed by your doctor      Avoid straining      STOP ANY ACTIVITY THAT CAUSES CHEST PAIN, SHORTNESS OF BREATH, DIZZINESS, SWEATING OR EXCESSIVE WEAKNESS      CALL 911 for chest discomfort not relieved by NTG or lasting longer than 20 minutes      Activity as tolerated         Follow-up Information    Follow up with EARLY, TODD, MD in 4 weeks. (Office will call you to arrange your appt (sent))    Contact information:   [place] [phone]          Discharge Vital Signs and Labs: Temp:  [98.2 F (36.8 C)-98.7 F (37.1 C)] 98.5 F (36.9 C) (09/14 0534) Pulse Rate:  [84-103] 84  (09/14 0534) Resp:  [13-18] 18  (09/14 0534) BP: (121-143)/(75-92) 121/75 mmHg (09/14 0534) SpO2:  [97 %-100 %] 98 % (09/14 0534) Weight:  [58 kg (127 lb 13.9 oz)-59.013 kg (130 lb 1.6 oz)] 59.013 kg (130 lb 1.6 oz) (09/13 2053)  Basename 10/09/11 0920  WBC 8.0  HGB 9.4*  HCT 27.8*  PLT 68*   Renal:  Basename 10/09/11 0920  NA 135  K 3.8  CL 93*  CO2 23  GLUCOSE 103*  BUN 75*  CREATININE 9.48*  CALCIUM 7.7*  PHOS 6.4*  ALBUMIN 3.5   Iron Studies: 37 % tfs  Ernest Haber, PA-C [place] [phone] 10/10/2011, 9:02 AM

## 2011-10-12 NOTE — Discharge Summary (Signed)
I have seen and examined this patient and agree with plan as outlined by Ernest Haber, PA-c. Yanni Ruberg A,MD 10/12/2011 8:43 AM

## 2011-11-03 ENCOUNTER — Ambulatory Visit: Payer: Self-pay | Admitting: Vascular Surgery

## 2011-11-16 ENCOUNTER — Encounter: Payer: Self-pay | Admitting: Vascular Surgery

## 2011-11-17 ENCOUNTER — Encounter: Payer: Self-pay | Admitting: Vascular Surgery

## 2011-11-17 ENCOUNTER — Ambulatory Visit (INDEPENDENT_AMBULATORY_CARE_PROVIDER_SITE_OTHER): Payer: Self-pay | Admitting: Vascular Surgery

## 2011-11-17 VITALS — BP 133/92 | HR 86 | Temp 98.4°F | Resp 16 | Ht 64.5 in | Wt 133.0 lb

## 2011-11-17 DIAGNOSIS — N186 End stage renal disease: Secondary | ICD-10-CM | POA: Insufficient documentation

## 2011-11-17 NOTE — Progress Notes (Signed)
Patient presents today for followup of right IJ hemodialysis catheter and left Cimino AV fistula creation by myself 1 10/05/2011. He reports no difficult with the catheter and this had good healing of his incision at his left wrist. On physical exam he has excellent maturation of the cephalic vein fistula. This mainly has outflow into his basilic vein at the antecubital space. There are several small side branches but this does not appear to be limiting maturation since he has excellent size at one month. He will continue use of his catheter for a total of 3 months following the surgery and should be able to access his fistula in [place] December. He will see [place] again on an as-needed basis. Dialysis will contact us for catheter removal at the appropriate time.

## 2012-02-25 ENCOUNTER — Other Ambulatory Visit: Payer: Self-pay | Admitting: *Deleted

## 2012-03-02 ENCOUNTER — Other Ambulatory Visit: Payer: Self-pay

## 2012-03-02 ENCOUNTER — Ambulatory Visit (HOSPITAL_COMMUNITY)
Admission: RE | Admit: 2012-03-02 | Discharge: 2012-03-02 | Disposition: A | Discharge status: Home / Self Care | Payer: Medicaid Other | Source: Ambulatory Visit | Attending: Vascular Surgery | Admitting: Vascular Surgery

## 2012-03-02 DIAGNOSIS — D638 Anemia in other chronic diseases classified elsewhere: Secondary | ICD-10-CM | POA: Insufficient documentation

## 2012-03-02 DIAGNOSIS — I120 Hypertensive chronic kidney disease with stage 5 chronic kidney disease or end stage renal disease: Secondary | ICD-10-CM | POA: Insufficient documentation

## 2012-03-02 DIAGNOSIS — N2581 Secondary hyperparathyroidism of renal origin: Secondary | ICD-10-CM | POA: Insufficient documentation

## 2012-03-02 DIAGNOSIS — N186 End stage renal disease: Secondary | ICD-10-CM | POA: Insufficient documentation

## 2012-03-02 DIAGNOSIS — Z4901 Encounter for fitting and adjustment of extracorporeal dialysis catheter: Secondary | ICD-10-CM | POA: Insufficient documentation

## 2012-03-02 NOTE — Progress Notes (Signed)
Dressing to right neck clean dry and intact.  Patient and friend voice understanding of discharge instructions.  Will keep dressing clean and dry and have it removed at dialysis tomorrow

## 2012-03-02 NOTE — Progress Notes (Addendum)
[facility] SHORT STAY H&P  CC:  Catheter removal   HPI:  This is a 42 y.o. male here for diatek catheter removal.  He had a left radio cephalic AVF placed [id number] as well as a TDC.  His fistula is working well and he is here today to have his HD catheter removed.  His ROS is unremarkable.  PMH:  1. ESRD Secondary to Presumed Hypertensive Disease with New start Hemodialysis  2. Hypertension  3. Anemia Chronic Disease  4. Secondary Hyperparathyroidism   PSH: 1. Left radio cephalic AVF [id number] 2. Ricardo placement 10/05/11  FH:  Non-Contributory  SH: Denies tobacco use  Allergies: No Known Allergies  Medications: .  amLODipine  5 mg  Oral  Daily   .  cefUROXime (ZINACEF) IV  1.5 g  Intravenous  On Call to OR   .  darbepoetin (ARANESP) injection - DIALYSIS  60 mcg  Subcutaneous  Once   .  simvastatin  20 mg  Oral  QPM   .  sodium chloride  3 mL  Intravenous  Q12H   .  DISCONTD: ceFAZolin  500 mg  Intramuscular  Q8H   HYDROcodone-acetaminophen  ROS:  See HPI  PHYSICAL EXAM  Filed Vitals:   03/02/12 1136  BP: 132/86  Pulse: 82  Temp: 98.3 F (36.8 C)  Resp: 18    Gen:  Well developed well nourished HEENT:  normocephalic Neck:  Right IJ catheter in place Heart:  RRR Lungs:  Non-labored Extremities:  Thrill present in left RC AVF Skin:  No obvious rashes Neuro:  In tact  Lab/X-ray:  none  Impression: This is a 43 y.o. male here for diatek catheter removal  Plan:  Removal of right diatek catheter  Theda Sers Dakari Cregger Gwenette Greet , PA-C [facility] [phone] 03/02/2012 11:57 AM  [facility] Catheter Removal Procedure Note  Diagnosis: ESRD with Functioning AVF/AVGG  Plan:  Remove right diatek catheter  Consent signed:  yes Time out completed:  yes Coumadin:  no PT/INR (if applicable):   Other labs:   Procedure: 1.  Sterile prepping and draping over catheter area 2. 0 ml 2% lidocaine plain instilled at  removal site. 3.  right catheter removed in its entirety with cuff in tact. 4.  Complications: none  5. Tip of catheter sent for culture:  no   Patient tolerated procedure well:  yes Pressure held, no bleeding noted, dressing applied Instructions given to the pt regarding wound care and bleeding.  OtherLaurence Slate [facility] 03/02/2012 1:09 PM

## 2013-09-10 ENCOUNTER — Emergency Department (HOSPITAL_COMMUNITY)
Admission: EM | Admit: 2013-09-10 | Discharge: 2013-09-10 | Disposition: A | Discharge status: Home / Self Care | Payer: No Typology Code available for payment source | Attending: Emergency Medicine | Admitting: Emergency Medicine

## 2013-09-10 ENCOUNTER — Encounter (HOSPITAL_COMMUNITY): Payer: Self-pay | Admitting: Emergency Medicine

## 2013-09-10 ENCOUNTER — Emergency Department (HOSPITAL_COMMUNITY): Payer: No Typology Code available for payment source

## 2013-09-10 DIAGNOSIS — N189 Chronic kidney disease, unspecified: Secondary | ICD-10-CM | POA: Diagnosis not present

## 2013-09-10 DIAGNOSIS — S0181XA Laceration without foreign body of other part of head, initial encounter: Secondary | ICD-10-CM

## 2013-09-10 DIAGNOSIS — S0180XA Unspecified open wound of other part of head, initial encounter: Secondary | ICD-10-CM | POA: Diagnosis present

## 2013-09-10 DIAGNOSIS — I129 Hypertensive chronic kidney disease with stage 1 through stage 4 chronic kidney disease, or unspecified chronic kidney disease: Secondary | ICD-10-CM | POA: Insufficient documentation

## 2013-09-10 DIAGNOSIS — S022XXA Fracture of nasal bones, initial encounter for closed fracture: Secondary | ICD-10-CM | POA: Diagnosis not present

## 2013-09-10 DIAGNOSIS — Z23 Encounter for immunization: Secondary | ICD-10-CM | POA: Insufficient documentation

## 2013-09-10 DIAGNOSIS — Z79899 Other long term (current) drug therapy: Secondary | ICD-10-CM | POA: Diagnosis not present

## 2013-09-10 LAB — CBC WITH DIFFERENTIAL/PLATELET
Basophils Absolute: 0 10*3/uL (ref 0.0–0.1)
Basophils Relative: 0 % (ref 0–1)
Eosinophils Absolute: 0.1 10*3/uL (ref 0.0–0.7)
Eosinophils Relative: 1 % (ref 0–5)
HCT: 35.8 % — ABNORMAL LOW (ref 39.0–52.0)
Hemoglobin: 11.9 g/dL — ABNORMAL LOW (ref 13.0–17.0)
Lymphocytes Relative: 19 % (ref 12–46)
Lymphs Abs: 1.3 10*3/uL (ref 0.7–4.0)
MCH: 29 pg (ref 26.0–34.0)
MCHC: 33.2 g/dL (ref 30.0–36.0)
MCV: 87.1 fL (ref 78.0–100.0)
Monocytes Absolute: 0.7 10*3/uL (ref 0.1–1.0)
Monocytes Relative: 10 % (ref 3–12)
Neutro Abs: 4.9 10*3/uL (ref 1.7–7.7)
Neutrophils Relative %: 70 % (ref 43–77)
Platelets: 123 10*3/uL — ABNORMAL LOW (ref 150–400)
RBC: 4.11 MIL/uL — ABNORMAL LOW (ref 4.22–5.81)
RDW: 12.7 % (ref 11.5–15.5)
WBC: 7 10*3/uL (ref 4.0–10.5)

## 2013-09-10 LAB — PROTIME-INR
INR: 1 (ref 0.00–1.49)
Prothrombin Time: 13.2 seconds (ref 11.6–15.2)

## 2013-09-10 LAB — BASIC METABOLIC PANEL
Anion gap: 20 — ABNORMAL HIGH (ref 5–15)
BUN: 75 mg/dL — ABNORMAL HIGH (ref 6–23)
CO2: 26 mEq/L (ref 19–32)
Calcium: 9.5 mg/dL (ref 8.4–10.5)
Chloride: 96 mEq/L (ref 96–112)
Creatinine, Ser: 11.75 mg/dL — ABNORMAL HIGH (ref 0.50–1.35)
GFR calc Af Amer: 5 mL/min — ABNORMAL LOW (ref >90)
GFR calc non Af Amer: 5 mL/min — ABNORMAL LOW (ref >90)
Glucose, Bld: 115 mg/dL — ABNORMAL HIGH (ref 70–99)
Potassium: 5.1 mEq/L (ref 3.7–5.3)
Sodium: 142 mEq/L (ref 137–147)

## 2013-09-10 MED ORDER — TETANUS-DIPHTH-ACELL PERTUSSIS 5-2.5-18.5 LF-MCG/0.5 IM SUSP
0.5000 mL | Freq: Once | INTRAMUSCULAR | Status: AC
  Administered 2013-09-10: 0.5 mL via INTRAMUSCULAR
  Filled 2013-09-10: qty 0.5

## 2013-09-10 MED ORDER — LIDOCAINE HCL 1 % IJ SOLN
INTRAMUSCULAR | Status: AC
  Filled 2013-09-10: qty 20

## 2013-09-10 MED ORDER — HYDROMORPHONE HCL PF 1 MG/ML IJ SOLN
INTRAMUSCULAR | Status: AC
  Administered 2013-09-10: 1 mg
  Filled 2013-09-10: qty 1

## 2013-09-10 MED ORDER — ONDANSETRON HCL 4 MG/2ML IJ SOLN
4.0000 mg | Freq: Once | INTRAMUSCULAR | Status: AC
  Administered 2013-09-10: 4 mg via INTRAVENOUS
  Filled 2013-09-10: qty 2

## 2013-09-10 MED ORDER — ONDANSETRON 8 MG PO TBDP: 8.0000 mg | ORAL_TABLET | Freq: Three times a day (TID) | ORAL | Status: DC | PRN

## 2013-09-10 MED ORDER — HYDROMORPHONE HCL PF 1 MG/ML IJ SOLN: 1.0000 mg | Freq: Once | INTRAMUSCULAR | Status: AC

## 2013-09-10 MED ORDER — OXYCODONE-ACETAMINOPHEN 5-325 MG PO TABS: 2.0000 | ORAL_TABLET | ORAL | Status: DC | PRN

## 2013-09-10 MED ORDER — ONDANSETRON HCL 4 MG/2ML IJ SOLN
INTRAMUSCULAR | Status: AC
  Administered 2013-09-10: 4 mg
  Filled 2013-09-10: qty 2

## 2013-09-10 MED ORDER — OXYCODONE-ACETAMINOPHEN 5-325 MG PO TABS
2.0000 | ORAL_TABLET | Freq: Once | ORAL | Status: AC
  Administered 2013-09-10: 2 via ORAL
  Filled 2013-09-10: qty 2

## 2013-09-10 NOTE — ED Notes (Signed)
Patient transported to CT 
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
## 2013-09-10 NOTE — Discharge Instructions (Signed)
Sutures should be removed in 5 days, and can be done when you are seen by the ENT specialist.  Watch for signs of infection:  Redness, increased swelling, or drainage of pus.  Sleep sitting up as much as possible to help with swelling and drainage.  You can use over the counter nasal saline to help with the dried blood in your nose.  Take medications as prescribed.  Return to the ER for fevers, worsening pain, vomiting despite medications, or other concerning symptoms.  Expect to have bruising to both eyes.  Use ice packs over the next 1-2 days to help with swelling.  Las suturas deben retirarse en 5 das, y se puede hacer cuando est visto por Statistician . Est atento a los signos de infeccin : enrojecimiento , aumento de la hinchazn o supuracin de pus . Sleep sentado tanto como sea posible para ayudar con la hinchazn y drenaje. Usted puede utilizar el contador de solucin salina nasal para ayudar con la sangre seca en la nariz . Tome los medicamentos segn las indicaciones. Regreso a Passenger transport manager de Freight forwarder para las fiebres , el empeoramiento del dolor , vmitos pesar de los medicamentos u otros sntomas relativos . Esperar a tener hematomas en ambos ojos. Use bolsas de hielo durante los prximos 1-2 das para ayudar con la hinchazn.  Cuidados de Risk manager - Adultos  (Laceration Care, Adult)  Una herida cortante es un corte o lesin que atraviesa todas las capas de la piel y el tejido que se encuentra debajo de la piel.  TRATAMIENTO  Algunas laceraciones no requieren sutura. Algunas no deben cerrarse debido a que puede aumentar el riesgo de infeccin. Es importante que consulte al mdico lo antes posible despus de recibir una lesin para minimizar el riesgo de infeccin y aumentar la posibilidad de que se cierre con xito.  Cuando se cierra adecuadamente, podrn indicarle analgsicos, si los necesita. La herida debe limpiarse para combatir la infeccin. El mdico  usar puntos (suturas), [place], o tiras [place] para Equities trader. Estos elementos mantendrn unidos los bordes de la piel para que se cure ms rpidamente y para un mejor resultado cosmtico. Sin embargo, todas las heridas se curarn con una cicatriz. Una vez que la herida se haya curado, las cicatrices pueden minimizarse cubriendo la herida con pantalla solar durante el da por un lapso se 1 ao.  INSTRUCCIONES PARA EL CUIDADO EN EL HOGAR  Si tiene puntos o grapas:   Mantenga la herida limpia y [place].  Si tiene un (vendaje) cmbielo al menos una vez al da. Cmbielo si se moja o se ensucia, o segn las indicaciones del [place] veces por da con agua y [place]. Enjuguelo con agua para quitar todo el [place]. Seque dando palmaditas con una toalla limpia y seca.  Despus de limpiar, aplique una delgada capa de una crema con antibitico segn las indicaciones del mdico. Esto le ayudar a prevenir las infecciones y a Product/process development scientist que el vendaje se Designer, fashion/clothing.  Puede ducharse despus de las primeras 24 horas. No remoje la herida en agua hasta que le hayan quitado los puntos.  Solo tome medicamentos que se pueden comprar sin receta o recetados para Conservation officer, historic buildings, Tree surgeon o fiebre, como le indica el mdico.  Concurra para que le retiren los puntos o las grapas cuando el mdico le indique. En caso que tenga tiras [id number]:   Mantenga la herida limpia y seca.  No deje que las  tiras se mojen. Puede darse un bao cuidando de [employer] herida seca.  Si se moja, squela dando palmaditas con una toalla limpia.  Las tiras caern por s mismas. Puede recortar las tiras a medida que la herida se [place]. No quite las tiras que estn pegadas a la herida. Ellas se caern cuando sea el momento. En caso que le hayan [place].   Podr mojara momentneamente la herida en la ducha o el bao. No frote ni sumerja la herida. No practique natacin. Evite transpirar con abundancia  hasta que el [place] se haya cado. Despus de ducharse o darse un bao, seque el corte dando palmaditas con una toalla limpia.  No aplique medicamentos lquidos, en crema o ungentos mientras el [employer] est en su lugar. Podr aflojarlo antes de que la herida se cure.  Si tiene un vendaje, tenga cuidado de no aplicar cinta adhesiva directamente [employer]. Esto puede hacer que el [place] se caiga antes de que la herida se haya curado.  Evite la exposicin prolongada a la luz del sol o a la Event organiser en [employer] se Network engineer. La exposicin a los Radiographer, therapeutic la Radio broadcast assistant.  El [employer] la piel durante 5 a 10 das y Loss adjuster, chartered. No quite la pelcula de Mabel. Deber aplicarse la vacuna contra el ttanos si:  No recuerda cundo se coloc la vacuna la ltima vez.  Nunca recibi esta vacuna. Si le han aplicado la vacuna contra el ttanos, el brazo podr hincharse, enrojecer y sentirse caliente al tacto. Esto es frecuente y no es un problema. Si usted necesita aplicarse la vacuna y se niega a recibirla, corre riesgo de contraer ttanos. sta es una enfermedad grave.  SOLICITE ATENCIN MDICA SI:   Presenta enrojecimiento, hinchazn o aumento del dolor en la herida.  Hay rayas rojas que salen de la herida.  Observa un lquido blanco amarillento (pus) en la herida.  Tiene fiebre.  Advierte un olor ftido que proviene de la herida o del vendaje.  La herida se abre luego de que le han extrado las suturas.  Nota que en la herida hay algn cuerpo extrao como un trozo de [place] o vidrio.  La herida est en su mano o pie y observa que no puede mover correctamente los dedos. SOLICITE ATENCIN MDICA DE INMEDIATO SI:   El dolor no se alivia con los [employer].  Hay una zona muy hinchada alrededor de la herida que le causa dolor y adormecimiento, o advierte un cambio en el color en el  brazo, la mano, la pierna o el pie.  La herida se abre y sangra nuevamente.  Siente que el adormecimiento, la debilidad o la prdida de la funcin de la articulacin que rodea la herida [place].  Palpa ndulos dolorosos cerca de la herida o bajo la piel en cualquier zona del cuerpo. ASEGRESE DE QUE:   Comprende estas instrucciones.  Controlar su enfermedad.  Solicitar ayuda de inmediato si no mejora o si empeora. Document Released: 01/12/2005 Document Revised: 04/06/2011 [facility] Patient Information 2015 [place]. This information is not intended to replace advice given to you by your health care provider. Make sure you discuss any questions you have with your health care provider.  Fractura de la Lawyer (Nasal Fracture)  La fractura nasal es la rotura de los huesos de la Lawyer. [name] fractura (quebradura de un hueso) menor generalmente se cura en un mes. Como consecuencia de la  fractura de la nariz, los ojos generalmente se ponen negros. Esto no es un motivo de preocupacin. El problema de los ojos negros se resolver (desaparecer) despus de 1  2 semanas.  DIAGNSTICO Si teme que usted o su nio tienen la nariz fracturada, el [employer]. Es posible que las radiografas de la nariz no muestren la [place], aunque sta [place]. En algunos casos, el profesional debe Country[employer] Financial 1 y 5 das despus de la lesin para volver a Mudlogger nariz y Customer service manager alineacin y tomar nuevas radiografas. Muchas veces el profesional debe esperar hasta que la hinchazn haya desaparecido. TRATAMIENTO  Una fractura menor que no ha provocado una deformidad, generalmente no requiere Clinical research associate. Las fracturas ms graves, en las que los huesos se [place], puede requerir [place]. Esta tendr lugar despus que la hinchazn haya disminuido. En la ciruga se estabilizar y alinear la fractura.  INSTRUCCIONES PARA EL CUIDADO DOMICILIARIO  Aplique hielo sobre la zona lesionada.  Ponga el  hielo en una bolsa plstica.  Colquese una toalla entre la piel y la bolsa de hielo.  Deje el hielo durante 15 a 20 minutos, 3 a 4 veces por da.  Tome los [employer] como le indic el profesional que lo asiste.  Utilice los medicamentos de venta libre o de prescripcin para Conservation officer, historic buildings, Health and safety inspector o la [place], segn se lo indique el profesional que lo asiste.  Si la [employer]s, comprima las partes blandas contra la pared central mientras permanece sentado en posicin erguida.durante 10 minutos.  Debe evitar los deportes de contacto durante al [employer] Technologies 3 o 4 semanas posteriores a una fractura nasal o hasta que el mdico lo autorice. SOLICITE ATENCIN MDICA SI:  El dolor aumenta o se vuelve muy intenso.  Sigue teniendo hemorragias nasales.  La forma de la nariz no vuelve a ser normal [employer] de 5 das.  Observa pus que proviene de la nariz. SOLICITE ATENCIN MDICA DE INMEDIATO SI:  Sufre una hemorragia nasal que no se detiene despus de 20 minutos de presionar los orificios nasales y de Glass blower/designer hielo en la Lawyer.  Observa que un lquido claro drena por la nariz.  Observa una hinchazn similar a una uva en el septum (la pared que divide las fosas nasales) Esto es una acumulacin de sangre (hematoma) que debe drenarse para prevenir la infeccin.  Tiene dificultad para mover los ojos.  Tiene vmitos continuos. Document Released: 10/22/2004 Document Revised: 04/06/2011 [facility] Patient Information 2015 [place]. This information is not intended to replace advice given to you by your health care provider. Make sure you discuss any questions you have with your health care provider.
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
## 2013-10-13 ENCOUNTER — Encounter (HOSPITAL_COMMUNITY): Payer: Self-pay | Admitting: Emergency Medicine

## 2013-10-13 ENCOUNTER — Emergency Department (HOSPITAL_COMMUNITY)
Admission: EM | Admit: 2013-10-13 | Discharge: 2013-10-14 | Disposition: A | Discharge status: Home / Self Care | Payer: Self-pay | Attending: Emergency Medicine | Admitting: Emergency Medicine

## 2013-10-13 DIAGNOSIS — R110 Nausea: Secondary | ICD-10-CM

## 2013-10-13 DIAGNOSIS — R030 Elevated blood-pressure reading, without diagnosis of hypertension: Secondary | ICD-10-CM

## 2013-10-13 DIAGNOSIS — IMO0001 Reserved for inherently not codable concepts without codable children: Secondary | ICD-10-CM

## 2013-10-13 DIAGNOSIS — N189 Chronic kidney disease, unspecified: Secondary | ICD-10-CM | POA: Insufficient documentation

## 2013-10-13 DIAGNOSIS — I129 Hypertensive chronic kidney disease with stage 1 through stage 4 chronic kidney disease, or unspecified chronic kidney disease: Secondary | ICD-10-CM | POA: Insufficient documentation

## 2013-10-13 DIAGNOSIS — E875 Hyperkalemia: Secondary | ICD-10-CM

## 2013-10-13 DIAGNOSIS — Z79899 Other long term (current) drug therapy: Secondary | ICD-10-CM | POA: Insufficient documentation

## 2013-10-13 LAB — CBC WITH DIFFERENTIAL/PLATELET
Basophils Absolute: 0 10*3/uL (ref 0.0–0.1)
Basophils Relative: 0 % (ref 0–1)
Eosinophils Absolute: 0 10*3/uL (ref 0.0–0.7)
Eosinophils Relative: 1 % (ref 0–5)
HCT: 37.4 % — ABNORMAL LOW (ref 39.0–52.0)
Hemoglobin: 12.1 g/dL — ABNORMAL LOW (ref 13.0–17.0)
Lymphocytes Relative: 16 % (ref 12–46)
Lymphs Abs: 1 10*3/uL (ref 0.7–4.0)
MCH: 29.9 pg (ref 26.0–34.0)
MCHC: 32.4 g/dL (ref 30.0–36.0)
MCV: 92.3 fL (ref 78.0–100.0)
Monocytes Absolute: 0.5 10*3/uL (ref 0.1–1.0)
Monocytes Relative: 8 % (ref 3–12)
Neutro Abs: 4.7 10*3/uL (ref 1.7–7.7)
Neutrophils Relative %: 75 % (ref 43–77)
Platelets: 154 10*3/uL (ref 150–400)
RBC: 4.05 MIL/uL — ABNORMAL LOW (ref 4.22–5.81)
RDW: 14.6 % (ref 11.5–15.5)
WBC: 6.1 10*3/uL (ref 4.0–10.5)

## 2013-10-13 LAB — COMPREHENSIVE METABOLIC PANEL
ALT: 12 U/L (ref 0–53)
AST: 20 U/L (ref 0–37)
Albumin: 4 g/dL (ref 3.5–5.2)
Alkaline Phosphatase: 68 U/L (ref 39–117)
Anion gap: 17 — ABNORMAL HIGH (ref 5–15)
BUN: 33 mg/dL — ABNORMAL HIGH (ref 6–23)
CO2: 28 mEq/L (ref 19–32)
Calcium: 8.8 mg/dL (ref 8.4–10.5)
Chloride: 95 mEq/L — ABNORMAL LOW (ref 96–112)
Creatinine, Ser: 6.14 mg/dL — ABNORMAL HIGH (ref 0.50–1.35)
GFR calc Af Amer: 12 mL/min — ABNORMAL LOW (ref >90)
GFR calc non Af Amer: 10 mL/min — ABNORMAL LOW (ref >90)
Glucose, Bld: 119 mg/dL — ABNORMAL HIGH (ref 70–99)
Potassium: 5.6 mEq/L — ABNORMAL HIGH (ref 3.7–5.3)
Sodium: 140 mEq/L (ref 137–147)
Total Bilirubin: 0.3 mg/dL (ref 0.3–1.2)
Total Protein: 8 g/dL (ref 6.0–8.3)

## 2013-10-13 MED ORDER — CLONIDINE HCL 0.1 MG PO TABS
0.1000 mg | ORAL_TABLET | Freq: Once | ORAL | Status: AC
  Administered 2013-10-13: 0.1 mg via ORAL
  Filled 2013-10-13: qty 1

## 2013-10-13 MED ORDER — ONDANSETRON HCL 4 MG/2ML IJ SOLN
4.0000 mg | Freq: Once | INTRAMUSCULAR | Status: AC
  Administered 2013-10-13: 4 mg via INTRAVENOUS
  Filled 2013-10-13: qty 2

## 2013-10-13 MED ORDER — SODIUM POLYSTYRENE SULFONATE 15 GM/60ML PO SUSP
30.0000 g | Freq: Once | ORAL | Status: AC
Start: 2013-10-14 — End: 2013-10-14
  Administered 2013-10-14: 30 g via ORAL
  Filled 2013-10-13: qty 120

## 2013-10-13 NOTE — ED Provider Notes (Signed)
CSN: [id number]     Arrival date & time 10/13/13  1946 History   First MD Initiated Contact with Patient 10/13/13 2230     Chief Complaint  Patient presents with  . Hypertension     (Consider location/radiation/quality/duration/timing/severity/associated sxs/prior Treatment) HPI Pt is a 43yo male with hx of HTN and CKD on dialysis (Mon/Wed/Fri) presenting to ED with c/o nausea and intermittent abdominal pain that started while he was in dialysis today.  Pt states during dialysis, they needed to stop because he was in pain and becoming hot and flush.  States his BP was elevated at that time, he would put cool water on his face to bring his BP down, but states it would keep going back up.  Reports being on amlodipine about 6-24mo ago but was advised by his PCP to discontinue as his pressure was doing well.  States at this time he is nauseated but denies abdominal pain, chest pain, vomiting or diarrhea.    Past Medical History  Diagnosis Date  . Hypertension   . Chronic kidney disease    Past Surgical History  Procedure Laterality Date  . No past surgeries    . Insertion of dialysis catheter  10/05/2011    Procedure: INSERTION OF DIALYSIS CATHETER;  Surgeon: Rosetta Posner, MD;  Location: [place];  Service: Vascular;  Laterality: N/A;  right internal jugular vein  . Av fistula placement  10/05/2011    Procedure: ARTERIOVENOUS (AV) FISTULA CREATION;  Surgeon: Rosetta Posner, MD;  Location: [place];  Service: Vascular;  Laterality: Left;   No family history on file. History  Substance Use Topics  . Smoking status: Never Smoker   . Smokeless tobacco: Never Used  . Alcohol Use: No    Review of Systems  Constitutional: Negative for fever and chills.  Respiratory: Negative for cough and shortness of breath.   Cardiovascular: Negative for chest pain and palpitations.  Gastrointestinal: Positive for nausea. Negative for vomiting, abdominal pain and diarrhea.  All other systems reviewed and are  negative.     Allergies  Review of patient's allergies indicates no known allergies.  Home Medications   Prior to Admission medications   Medication Sig Start Date End Date Taking? Authorizing Provider  allopurinol (ZYLOPRIM) 100 MG tablet Take 200 mg by mouth daily.   Yes Historical Provider, MD  calcium elemental as carbonate (TUMS ULTRA 1000) 400 MG tablet Chew 2,000 mg by mouth 3 (three) times daily with meals.   Yes Historical Provider, MD  clonazePAM (KLONOPIN) 1 MG tablet Take 1 mg by mouth daily as needed (cramps).   Yes Historical Provider, MD  multivitamin (RENA-VIT) TABS tablet Take 1 tablet by mouth at bedtime. 10/10/11  Yes Foye Clock, PA-C  simvastatin (ZOCOR) 20 MG tablet Take 20 mg by mouth every evening.   Yes Historical Provider, MD  amLODipine (NORVASC) 10 MG tablet Take 1 tablet (10 mg total) by mouth daily. 10/14/13   Noland Fordyce, PA-C   BP 142/90  Pulse 76  Temp(Src) 98.3 F (36.8 C) (Oral)  Resp 19  Ht 5\' 1"  (1.549 m)  Wt 142 lb (64.411 kg)  BMI 26.84 kg/m2  SpO2 100% Physical Exam  Nursing note and vitals reviewed. Constitutional: He appears well-developed and well-nourished.  Pt lying comfortably in exam bed, NAD.   HENT:  Head: Normocephalic and atraumatic.  Eyes: Conjunctivae are normal. No scleral icterus.  Neck: Normal range of motion.  Cardiovascular: Normal rate, regular rhythm and normal heart  sounds.   Pulmonary/Chest: Effort normal and breath sounds normal. No respiratory distress. He has no wheezes. He has no rales. He exhibits no tenderness.  Abdominal: Soft. Bowel sounds are normal. He exhibits no distension and no mass. There is no tenderness. There is no rebound and no guarding.  Soft, non-distended, non-tender. No CVAT  Musculoskeletal: Normal range of motion.  Neurological: He is alert.  Skin: Skin is warm and dry.    ED Course  Procedures (including critical care time) Labs Review Labs Reviewed  CBC WITH DIFFERENTIAL -  Abnormal; Notable for the following:    RBC 4.05 (*)    Hemoglobin 12.1 (*)    HCT 37.4 (*)    All other components within normal limits  COMPREHENSIVE METABOLIC PANEL - Abnormal; Notable for the following:    Potassium 5.6 (*)    Chloride 95 (*)    Glucose, Bld 119 (*)    BUN 33 (*)    Creatinine, Ser 6.14 (*)    GFR calc non Af Amer 10 (*)    GFR calc Af Amer 12 (*)    Anion gap 17 (*)    All other components within normal limits    Imaging Review Dg Chest 2 View  10/14/2013   CLINICAL DATA:  Hypertension  EXAM: CHEST  2 VIEW  COMPARISON:  None.  FINDINGS: Normal mediastinum and cardiac silhouette. Normal pulmonary vasculature. No evidence of effusion, infiltrate, or pneumothorax. No acute bony abnormality.  IMPRESSION: No acute cardiopulmonary process.   Electronically Signed   By: Suzy Bouchard M.D.   On: 10/14/2013 00:56     EKG Interpretation   Date/Time:  Friday October 13 2013 22:59:21 EDT Ventricular Rate:  80 PR Interval:  175 QRS Duration: 86 QT Interval:  392 QTC Calculation: 452 R Axis:   46 Text Interpretation:  Sinus rhythm Confirmed by Kathrynn Humble, MD, Thelma Comp  [phone]) on 10/13/2013 11:46:13 PM      MDM   Final diagnoses:  Nausea  Elevated BP  Hyperkalemia    Pt is a 43yo male on dialysis, MWF, presenting to ED due to elevated BP, was unable to finish dialysis. C/o nausea in ED. Denies SOB, chest pain or abdominal pain in ED.  BP has been up to 176/111 in ED.  Will give 0.1mg  clonidine and reevaluate BP.  Pt appears well, non-toxic, NAD, afebrile, Lungs: CTAB, abdomen- soft, non-tender.  Discussed pt with Dr. Kathrynn Humble.  Will get CXR and give pt kayexalate due to slightly elevated BP of 43.  EKG: unremarkable. CXR: no acute cardiopulmonary process.  Pt may be discharged home as no evidence of end organ damage at this time.  Pt advised to call to finish dialysis in the morning.  Return precautions provided. Pt verbalized understanding and agreement with  tx plan.     Noland Fordyce, PA-C 10/14/13 [phone]

## 2013-10-13 NOTE — ED Notes (Signed)
The pt is dialysis and was dialyzed today.  He has niot needed to take bp meds for 6=7 monthsd but today it was very high after dialysis.

## 2013-10-14 ENCOUNTER — Emergency Department (HOSPITAL_COMMUNITY): Payer: Self-pay

## 2013-10-14 MED ORDER — SODIUM POLYSTYRENE SULFONATE 15 GM/60ML PO SUSP: 30.0000 g | Freq: Once | ORAL | Status: DC

## 2013-10-14 MED ORDER — AMLODIPINE BESYLATE 10 MG PO TABS: 10.0000 mg | ORAL_TABLET | Freq: Every day | ORAL | Status: DC

## 2013-10-14 MED ORDER — SODIUM POLYSTYRENE SULFONATE 15 GM/60ML PO SUSP
30.0000 g | Freq: Once | ORAL | Status: AC
  Administered 2013-10-14: 30 g via ORAL
  Filled 2013-10-14: qty 120

## 2013-10-14 NOTE — ED Notes (Signed)
Patient transported to X-ray 

## 2013-10-14 NOTE — Discharge Instructions (Signed)
Plan de alimentacin DASH (DASH Eating Plan) DASH es la sigla en ingls de "Enfoques Alimentarios para Detener la Hipertensin". El plan de alimentacin DASH ha demostrado bajar la presin arterial elevada (hipertensin). Los beneficios adicionales para la salud pueden incluir la disminucin del riesgo de diabetes mellitus tipo2, enfermedades cardacas e ictus. Este plan tambin puede ayudar a Horticulturist, commercial. QU DEBO SABER ACERCA DEL PLAN DE ALIMENTACIN DASH? Para el plan de alimentacin DASH, seguir las siguientes pautas generales:  Elija los alimentos con un valor porcentual diario de sodio de menos del 5% (segn figura en la etiqueta del alimento).  Use hierbas o aderezos sin sal, en lugar de sal de mesa o sal marina.  Consulte al mdico o farmacutico antes de usar sustitutos de la sal.  Coma productos con bajo contenido de sodio, cuya etiqueta suele decir "bajo contenido de sodio" o "sin agregado de sal".  Coma alimentos frescos.  Coma ms verduras, frutas y productos lcteos con bajo contenido de [place].  Elija los cereales integrales. Busque la palabra "integral" en Equities trader de la lista de ingredientes.  Elija el pescado y el pollo o el pavo sin piel ms a menudo que las carnes rojas. Limite el consumo de pescado, carne de ave y carne a 6onzas (170g) por Training and development officer.  Limite el consumo de dulces, postres, azcares y bebidas azucaradas.  Elija las grasas saludables para el corazn.  Limite el consumo de queso a 1onza (28g) por Training and development officer.  Consuma ms comida casera y menos de restaurante, de buf y comida rpida.  Limite el consumo de alimentos fritos.  Cocine los alimentos utilizando mtodos que no sean la fritura.  Limite las verduras enlatadas. Si las consume, enjuguelas bien para disminuir el sodio.  Cuando coma en un restaurante, pida que preparen su comida con menos sal o, en lo posible, sin nada de sal. QU ALIMENTOS PUEDO COMER? Pida ayuda a un nutricionista para  conocer las necesidades calricas individuales. Cereales Pan de salvado o integral. Arroz integral. Pastas de salvado o integrales. Quinua, trigo burgol y cereales integrales. Cereales con bajo contenido de sodio. Tortillas de harina de maz o de salvado. Pan de maz integral. Galletas saladas integrales. Galletas con bajo contenido de [place]. Vegetales Verduras frescas o congeladas (crudas, al vapor, asadas o grilladas). Jugos de tomate y verduras con contenido bajo o reducido de sodio. Pasta y salsa de tomate con contenido bajo o [place]. Verduras enlatadas con bajo contenido de sodio o reducido de sodio.  Lambert Mody Lambert Mody frescas, en conserva (en su jugo natural) o frutas congeladas. Carnes y otros productos con protenas Carne de res molida (al 85% o ms [place]), carne de res de animales alimentados con pastos o carne de res sin la grasa. Pollo o pavo sin piel. Carne de pollo o de [place]. Cerdo sin la grasa. Todos los pescados y frutos de mar. Huevos. Porotos, guisantes o lentejas secos. Frutos secos y semillas sin sal. Frijoles enlatados sin sal. Lcteos Productos lcteos con bajo contenido de grasas, como [place] o al 1%, quesos reducidos en grasas o al 2%, ricota con bajo contenido de grasas o [employer], o yogur natural con bajo contenido de [place]. Quesos con contenido bajo o reducido de sodio. Grasas y Naval architect en barra que no contengan grasas trans. [place] y alios para ensaladas livianos o reducidos en grasas (reducidos en sodio). [place]. Aceites de crtamo, oliva o canola. [place] natural de man o almendra. Otros Palomitas de maz y pretzels sin sal.  Los artculos mencionados arriba pueden no ser [employer] Foods [employer] de las bebidas o los alimentos recomendados. Comunquese con el nutricionista para conocer ms opciones. QU ALIMENTOS NO SE RECOMIENDAN? Cereales Pan blanco. Pastas blancas. Arroz blanco. Pan de maz refinado. Bagels y  croissants. Galletas saladas que contengan grasas trans. Vegetales Vegetales con crema o fritos. Verduras en [place]. Verduras enlatadas comunes. Pasta y salsa de tomate en lata comunes. Jugos comunes de tomate y de verduras. Lambert Mody Frutas secas. Fruta enlatada en almbar liviano o espeso. Jugo de frutas. Carnes y otros productos con protenas [place] de carne con Lobbyist. Costillas, alas de pollo, tocineta, salchicha, mortadela, salame, chinchulines, tocino, perros calientes, salchichas alemanas y embutidos envasados. Frutos secos y semillas con sal. Frijoles con sal en lata. Lcteos Leche entera o al 2%, crema, mezcla de [place] y crema, y queso crema. Yogur entero o endulzado. Quesos o queso azul con alto contenido de Physicist, medical. [place] no lcteas y coberturas batidas. Quesos procesados, quesos para untar o cuajadas. Condimentos Sal de cebolla y ajo, sal condimentada, sal de mesa y sal marina. Salsas en lata y envasadas. Salsa Worcestershire. Salsa trtara. Salsa barbacoa. Salsa teriyaki. Salsa de soja, incluso la que tiene contenido reducido de [place]. Salsa de carne. Salsa de pescado. Salsa de [place]. Salsa rosada. Rbano picante. Ketchup y mostaza. Saborizantes y tiernizantes para carne. Caldo en cubitos. Salsa picante. Salsa tabasco. Adobos. Aderezos para tacos. Salsas. Grasas y aceites [place], [place] en barra, [place] de [place], [place], [place] clarificada y Wendee Copp de tocino. Aceites de coco, de palmiste o de palma. Aderezos comunes para ensalada. Otros Pickles y [place]. Palomitas de maz y pretzels con sal. Los artculos mencionados arriba pueden no ser [employer] Foods Company de las bebidas y los alimentos que se Higher education careers adviser. Comunquese con el nutricionista para obtener ms informacin. DNDE Dolan Amen MS INFORMACIN? [place], del Pulmn y de la Sangre (National Heart, Lung, and [place]):  [email or web address] Document Released: 01/01/2011 Document Revised: 05/29/2013 [facility] Patient Information 2015 [place], [place]. This information is not intended to replace advice given to you by your health care provider. Make sure you discuss any questions you have with your health care provider.  Hoja de registro de la presin arterial (Blood Pressure Record Sheet) Su presin arterial en esta visita al servicio de emergencias o a la clnica es elevada. Esto no necesariamente significa que tiene presin arterial alta (hipertensin), sino que debe volver a controlarse la presin arterial. Muchas veces, la presin arterial puede aumentar debido a alguna enfermedad, dolor, ansiedad u otros factores. Recomendamos que se realice una serie de lecturas de la presin arterial durante un perodo de 5 das. Lo mejor es realizar una lectura a la maana y [name] a la noche. Sintese y reljese entre 1 y 5 minutos antes de Warden/ranger. Anote las lecturas y concierte una cita de seguimiento con su mdico para [employer]. Si no hay una clnica gratuita o una farmacia con una mquina para tomar la presin arterial cerca de donde usted se encuentra, puede comprar un equipo para tomar la presin arterial en una farmacia. Tener uno en el hogar le brinda la comodidad de tomarse la presin arterial mientras est en su casa y [place].  Su presin arterial en el servicio de emergencias o la clnica el ________ fue ____________________. REGISTRO DE LA PRESIN ARTERIAL Fecha: _______________________  a.m. _____________________  p.m. _____________________ Toma Copier: _______________________  a.m. _____________________  p.m. _____________________ Toma Copier: _______________________  a.m. _____________________  p.m.  _____________________ Toma Copier: _______________________  a.m. _____________________  p.m. _____________________ Toma Copier: _______________________  a.m.  _____________________  p.m. _____________________ Document Released: 11/09/2006 Document Revised: 05/29/2013 [employer] Patient Information 2015 [place], [place]. This information is not intended to replace advice given to you by your health care provider. Make sure you discuss any questions you have with your health care provider.

## 2013-10-14 NOTE — ED Provider Notes (Signed)
Medical screening examination/treatment/procedure(s) were performed by non-physician practitioner and as supervising physician I was immediately available for consultation/collaboration.   EKG Interpretation   Date/Time:  Friday October 13 2013 22:59:21 EDT Ventricular Rate:  80 PR Interval:  175 QRS Duration: 86 QT Interval:  392 QTC Calculation: 452 R Axis:   46 Text Interpretation:  Sinus rhythm Confirmed by Kathrynn Humble, MD, Thelma Comp  [phone]) on 10/13/2013 11:46:13 PM       Varney Biles, MD 10/14/13 [id number]

## 2013-10-18 IMAGING — CR DG CHEST 1V PORT
1 series · 1 of 1 positions shown · non-contrast
Comparison: None.

CLINICAL DATA: Dialysis catheter placement.

PORTABLE CHEST - 1 VIEW [DATE]/[id number] hours:

[AP]
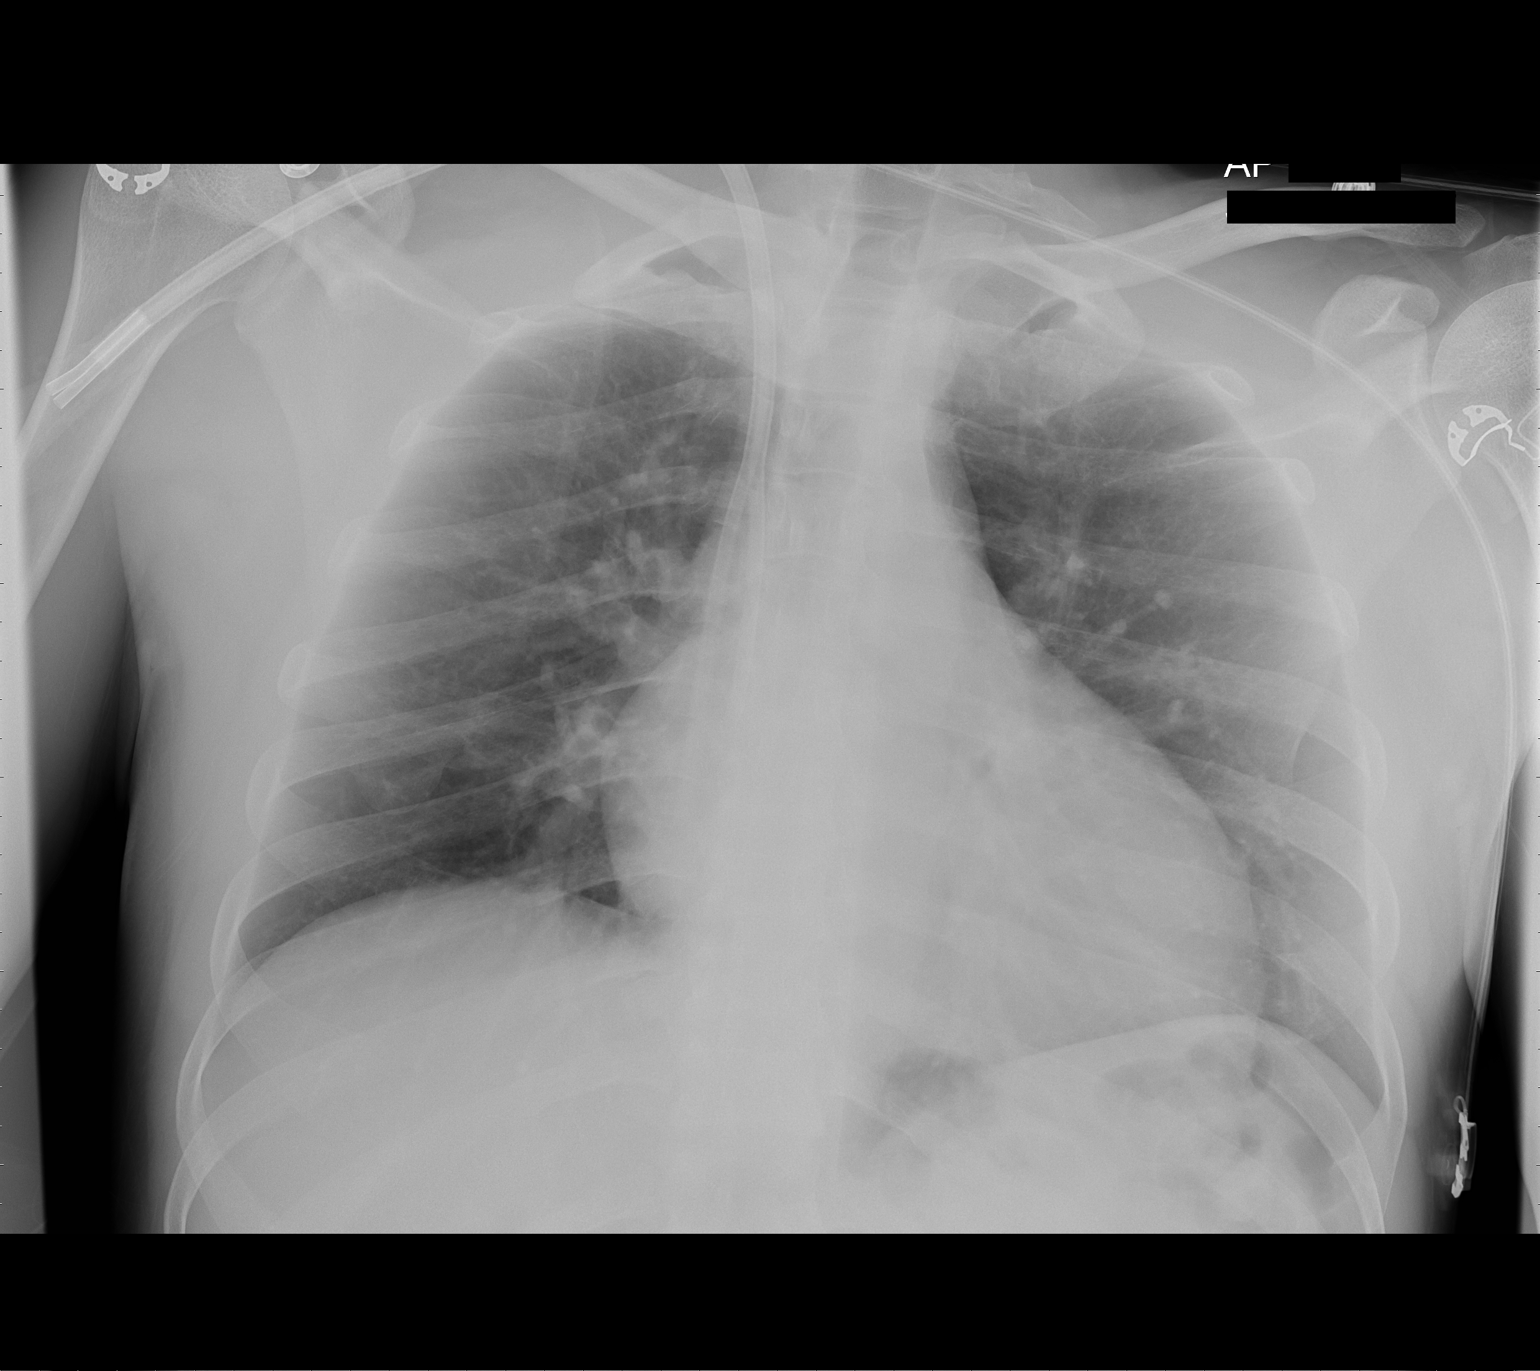

[1 of 1 positions shown; findings below may reference images not displayed]

FINDINGS: Right jugular dialysis catheter tips overlie the
cavoatrial junction and upper right atrium.  No evidence of
pneumothorax or mediastinal hematoma.  Cardiac silhouette mildly
enlarged for technique.  Pulmonary vascularity normal.  Lungs
clear.  Bronchovascular markings normal.  Pulmonary vascularity
normal.  No pneumothorax.  No pleural effusions.
IMPRESSION: Right jugular dialysis catheter tips overlie the cavoatrial
junction and upper right atrium.  No acute complicating features.
No acute cardiopulmonary disease.

## 2014-04-06 ENCOUNTER — Other Ambulatory Visit: Payer: Self-pay | Admitting: Nephrology

## 2014-04-06 MED ORDER — CLONAZEPAM 1 MG PO TABS: 1.0000 mg | ORAL_TABLET | Freq: Every day | ORAL | Status: DC | PRN

## 2014-07-20 ENCOUNTER — Telehealth: Payer: Self-pay | Admitting: Internal Medicine

## 2014-07-20 NOTE — Telephone Encounter (Signed)
Received records from [name] for appointment on 09/18/14 with Dr Debara Pickett.  Records given to [name] (medical records) for Dr Lysbeth Penner schedule on 09/18/14. lp

## 2014-09-04 ENCOUNTER — Encounter: Payer: Self-pay | Admitting: Internal Medicine

## 2014-09-04 ENCOUNTER — Telehealth: Payer: Self-pay | Admitting: Internal Medicine

## 2014-09-07 NOTE — Telephone Encounter (Signed)
Close encounter 

## 2014-09-18 ENCOUNTER — Ambulatory Visit: Payer: No Typology Code available for payment source | Admitting: Internal Medicine

## 2014-09-20 ENCOUNTER — Ambulatory Visit (INDEPENDENT_AMBULATORY_CARE_PROVIDER_SITE_OTHER): Payer: Self-pay | Admitting: Internal Medicine

## 2014-09-20 ENCOUNTER — Encounter: Payer: Self-pay | Admitting: Internal Medicine

## 2014-09-20 VITALS — BP 152/100 | HR 85 | Ht 63.0 in | Wt 136.7 lb

## 2014-09-20 DIAGNOSIS — N186 End stage renal disease: Secondary | ICD-10-CM

## 2014-09-20 DIAGNOSIS — Z992 Dependence on renal dialysis: Secondary | ICD-10-CM

## 2014-09-20 DIAGNOSIS — I10 Essential (primary) hypertension: Secondary | ICD-10-CM

## 2014-09-20 DIAGNOSIS — R079 Chest pain, unspecified: Secondary | ICD-10-CM

## 2014-09-20 DIAGNOSIS — R5383 Other fatigue: Secondary | ICD-10-CM

## 2014-09-20 DIAGNOSIS — E785 Hyperlipidemia, unspecified: Secondary | ICD-10-CM

## 2014-09-20 NOTE — Patient Instructions (Addendum)
Your physician has requested that you have an exercise stress myoview. For further information please visit [email or web address]. Please follow instruction sheet, as given.  Your physician recommends that you schedule a follow-up appointment after your stress test with Dr. Debara Pickett (with translator)

## 2014-09-21 ENCOUNTER — Encounter: Payer: Self-pay | Admitting: Internal Medicine

## 2014-09-21 DIAGNOSIS — R079 Chest pain, unspecified: Secondary | ICD-10-CM | POA: Insufficient documentation

## 2014-09-21 DIAGNOSIS — I10 Essential (primary) hypertension: Secondary | ICD-10-CM | POA: Insufficient documentation

## 2014-09-21 DIAGNOSIS — N186 End stage renal disease: Secondary | ICD-10-CM | POA: Insufficient documentation

## 2014-09-21 DIAGNOSIS — R5383 Other fatigue: Secondary | ICD-10-CM | POA: Insufficient documentation

## 2014-09-21 DIAGNOSIS — Z992 Dependence on renal dialysis: Secondary | ICD-10-CM

## 2014-09-21 DIAGNOSIS — E785 Hyperlipidemia, unspecified: Secondary | ICD-10-CM | POA: Insufficient documentation

## 2014-09-21 NOTE — Progress Notes (Signed)
OFFICE NOTE  Chief Complaint:  Chest pain, fatigue  Primary Care Physician: No PCP Per Patient  HPI:  Cole [name] is a pleasant 44 yo Hispanic male referred by Dr. Jimmy Footman for chest pain. He has a history of hypertension, dyslipidemia and ESRD on HD on M,W,F. He has a fistula in the left forearm through which he is dialyzed. Recently, he has been having episodes of chest pain, worse when doing activities such as swimming that goes across from the left to right side of the chest. He also gets chest pressure while trying to hold his breath, which makes him cough. He has problems with his nostrils as well, saying they often feel clogged. He has recently had increased mucous production.  PMHx:  Past Medical History  Diagnosis Date  . Hypertension   . Chronic kidney disease     Past Surgical History  Procedure Laterality Date  . No past surgeries    . Insertion of dialysis catheter  10/05/2011    Procedure: INSERTION OF DIALYSIS CATHETER;  Surgeon: Rosetta Posner, MD;  Location: [facility];  Service: Vascular;  Laterality: N/A;  right internal jugular vein  . Av fistula placement  10/05/2011    Procedure: ARTERIOVENOUS (AV) FISTULA CREATION;  Surgeon: Rosetta Posner, MD;  Location: [facility] OR;  Service: Vascular;  Laterality: Left;    FAMHx:  Family History  Problem Relation Age of Onset  . Hypertension Mother   . Lung cancer Father     SOCHx:   reports that he has never smoked. He has never used smokeless tobacco. He reports that he does not drink alcohol or use illicit drugs.  ALLERGIES:  No Known Allergies  ROS: A comprehensive review of systems was negative except for: Respiratory: positive for cough, dyspnea on exertion and sputum Cardiovascular: positive for exertional chest pressure/discomfort  HOME MEDS: Current Outpatient Prescriptions  Medication Sig Dispense Refill  . allopurinol (ZYLOPRIM) 100 MG tablet Take 200 mg by mouth daily.    Marland Kitchen amLODipine (NORVASC) 10 MG  tablet Take 1 tablet (10 mg total) by mouth daily. 30 tablet 0  . calcium elemental as carbonate (TUMS ULTRA 1000) 400 MG tablet Chew 2,000 mg by mouth 3 (three) times daily with meals.    . clonazePAM (KLONOPIN) 1 MG tablet Take 1 tablet (1 mg total) by mouth daily as needed (cramps). 30 tablet 0  . multivitamin (RENA-VIT) TABS tablet Take 1 tablet by mouth at bedtime.    . simvastatin (ZOCOR) 20 MG tablet Take 20 mg by mouth every evening.     No current facility-administered medications for this visit.    LABS/IMAGING: No results found for this or any previous visit (from the past 48 hour(s)). No results found.  WEIGHTS: Wt Readings from Last 3 Encounters:  09/20/14 136 lb 11.2 oz (62.007 kg)  10/13/13 142 lb (64.411 kg)  03/02/12 130 lb (58.968 kg)    VITALS: BP 152/100 mmHg  Pulse 85  Ht 5\' 3"  (1.6 m)  Wt 136 lb 11.2 oz (62.007 kg)  BMI 24.22 kg/m2  EXAM: General appearance: alert and no distress Neck: no carotid bruit and no JVD Lungs: clear to auscultation bilaterally Heart: regular rate and rhythm, S1, S2 normal, no murmur, click, rub or gallop Abdomen: soft, non-tender; bowel sounds normal; no masses,  no organomegaly Extremities: extremities normal, atraumatic, no cyanosis or edema and prominent fistula in the left forearm, +thrill Pulses: 2+ and symmetric Skin: Skin color, texture, turgor normal. No rashes  or lesions Neurologic: Grossly normal Psych: Pleasant  EKG: Normal sinus rhythm at 85, possible left atrial enlargement  ASSESSMENT: 1. Chest pain and DOE 2. Cough with increased sputum 3. HTN 4. Dyslipidemia 5. ESRD on HD (MWF)  PLAN: 1.   Mr. Furlong has been having chest pain, DOE (especially when swimming) and fatigue. Given his risk factors of ESRD on HD, dyslipidemia, and HTN, I'm concerned about CAD as well. I would recommend a exercise stress myoview.  Plan to see him back with a Spanish translator to go over the results in a few  weeks.  Thanks for the kind referral.  Pixie Casino, MD, [facility] Attending Cardiologist [place] 09/21/2014, 6:38 PM

## 2014-09-27 ENCOUNTER — Telehealth (HOSPITAL_COMMUNITY): Payer: Self-pay | Admitting: Radiology

## 2014-09-27 NOTE — Telephone Encounter (Signed)
Encounter complete. 

## 2014-09-28 ENCOUNTER — Telehealth (HOSPITAL_COMMUNITY): Payer: Self-pay | Admitting: Radiology

## 2014-09-28 NOTE — Telephone Encounter (Signed)
Encounter complete. 

## 2014-10-02 ENCOUNTER — Ambulatory Visit (HOSPITAL_COMMUNITY)
Admission: RE | Admit: 2014-10-02 | Discharge: 2014-10-02 | Disposition: A | Discharge status: Home / Self Care | Payer: Self-pay | Source: Ambulatory Visit | Attending: Cardiovascular Disease | Admitting: Cardiovascular Disease

## 2014-10-02 DIAGNOSIS — R5383 Other fatigue: Secondary | ICD-10-CM

## 2014-10-02 DIAGNOSIS — R9439 Abnormal result of other cardiovascular function study: Secondary | ICD-10-CM | POA: Insufficient documentation

## 2014-10-02 DIAGNOSIS — E785 Hyperlipidemia, unspecified: Secondary | ICD-10-CM

## 2014-10-02 DIAGNOSIS — I10 Essential (primary) hypertension: Secondary | ICD-10-CM

## 2014-10-02 DIAGNOSIS — N186 End stage renal disease: Secondary | ICD-10-CM

## 2014-10-02 DIAGNOSIS — I120 Hypertensive chronic kidney disease with stage 5 chronic kidney disease or end stage renal disease: Secondary | ICD-10-CM | POA: Insufficient documentation

## 2014-10-02 DIAGNOSIS — Z992 Dependence on renal dialysis: Secondary | ICD-10-CM

## 2014-10-02 DIAGNOSIS — I517 Cardiomegaly: Secondary | ICD-10-CM | POA: Insufficient documentation

## 2014-10-02 DIAGNOSIS — R079 Chest pain, unspecified: Secondary | ICD-10-CM

## 2014-10-02 LAB — MYOCARDIAL PERFUSION IMAGING
LV dias vol: 218 mL
LV sys vol: 149 mL
Peak HR: 113 {beats}/min
Rest HR: 88 {beats}/min
SDS: 3
SRS: 1
SSS: 4
TID: 1.05

## 2014-10-02 MED ORDER — TECHNETIUM TC 99M SESTAMIBI GENERIC - CARDIOLITE
10.5000 | Freq: Once | INTRAVENOUS | Status: AC | PRN
  Administered 2014-10-02: 11 via INTRAVENOUS

## 2014-10-02 MED ORDER — TECHNETIUM TC 99M SESTAMIBI GENERIC - CARDIOLITE
29.1000 | Freq: Once | INTRAVENOUS | Status: AC | PRN
  Administered 2014-10-02: 29.1 via INTRAVENOUS

## 2014-10-02 MED ORDER — AMINOPHYLLINE 25 MG/ML IV SOLN
75.0000 mg | Freq: Once | INTRAVENOUS | Status: AC
  Administered 2014-10-02: 75 mg via INTRAVENOUS

## 2014-10-02 MED ORDER — REGADENOSON 0.4 MG/5ML IV SOLN
0.4000 mg | Freq: Once | INTRAVENOUS | Status: AC
  Administered 2014-10-02: 0.4 mg via INTRAVENOUS

## 2014-10-16 ENCOUNTER — Ambulatory Visit
Admission: RE | Admit: 2014-10-16 | Discharge: 2014-10-16 | Disposition: A | Discharge status: Home / Self Care | Payer: PRIVATE HEALTH INSURANCE | Source: Ambulatory Visit | Attending: Internal Medicine | Admitting: Internal Medicine

## 2014-10-16 ENCOUNTER — Encounter: Payer: Self-pay | Admitting: Internal Medicine

## 2014-10-16 ENCOUNTER — Ambulatory Visit (INDEPENDENT_AMBULATORY_CARE_PROVIDER_SITE_OTHER): Payer: Self-pay | Admitting: Internal Medicine

## 2014-10-16 VITALS — BP 142/102 | HR 88 | Ht 63.5 in | Wt 135.5 lb

## 2014-10-16 DIAGNOSIS — R0602 Shortness of breath: Secondary | ICD-10-CM

## 2014-10-16 DIAGNOSIS — Z01818 Encounter for other preprocedural examination: Secondary | ICD-10-CM

## 2014-10-16 DIAGNOSIS — I209 Angina pectoris, unspecified: Secondary | ICD-10-CM

## 2014-10-16 DIAGNOSIS — N186 End stage renal disease: Secondary | ICD-10-CM

## 2014-10-16 DIAGNOSIS — R931 Abnormal findings on diagnostic imaging of heart and coronary circulation: Secondary | ICD-10-CM

## 2014-10-16 DIAGNOSIS — R5383 Other fatigue: Secondary | ICD-10-CM

## 2014-10-16 DIAGNOSIS — R9439 Abnormal result of other cardiovascular function study: Secondary | ICD-10-CM

## 2014-10-16 DIAGNOSIS — I519 Heart disease, unspecified: Secondary | ICD-10-CM

## 2014-10-16 DIAGNOSIS — I5021 Acute systolic (congestive) heart failure: Secondary | ICD-10-CM

## 2014-10-16 DIAGNOSIS — Z01812 Encounter for preprocedural laboratory examination: Secondary | ICD-10-CM

## 2014-10-16 DIAGNOSIS — Z79899 Other long term (current) drug therapy: Secondary | ICD-10-CM

## 2014-10-16 DIAGNOSIS — I5020 Unspecified systolic (congestive) heart failure: Secondary | ICD-10-CM | POA: Insufficient documentation

## 2014-10-16 DIAGNOSIS — D689 Coagulation defect, unspecified: Secondary | ICD-10-CM

## 2014-10-16 LAB — CBC
HCT: 39.7 % (ref 39.0–52.0)
Hemoglobin: 13 g/dL (ref 13.0–17.0)
MCH: 28.8 pg (ref 26.0–34.0)
MCHC: 32.7 g/dL (ref 30.0–36.0)
MCV: 87.8 fL (ref 78.0–100.0)
MPV: 10.2 fL (ref 8.6–12.4)
Platelets: 141 10*3/uL — ABNORMAL LOW (ref 150–400)
RBC: 4.52 MIL/uL (ref 4.22–5.81)
RDW: 17.8 % — ABNORMAL HIGH (ref 11.5–15.5)
WBC: 6.2 10*3/uL (ref 4.0–10.5)

## 2014-10-16 LAB — PROTIME-INR
INR: 0.99 (ref <1.50)
Prothrombin Time: 13.2 seconds (ref 11.6–15.2)

## 2014-10-16 LAB — BASIC METABOLIC PANEL
BUN: 53 mg/dL — ABNORMAL HIGH (ref 7–25)
CO2: 27 mmol/L (ref 20–31)
Calcium: 9.4 mg/dL (ref 8.6–10.3)
Chloride: 93 mmol/L — ABNORMAL LOW (ref 98–110)
Creat: 9.2 mg/dL — ABNORMAL HIGH (ref 0.60–1.35)
Glucose, Bld: 82 mg/dL (ref 65–99)
Potassium: 5.5 mmol/L — ABNORMAL HIGH (ref 3.5–5.3)
Sodium: 138 mmol/L (ref 135–146)

## 2014-10-16 LAB — APTT: aPTT: 31 seconds (ref 24–37)

## 2014-10-16 LAB — TSH: TSH: 1.942 u[IU]/mL (ref 0.350–4.500)

## 2014-10-16 MED ORDER — CARVEDILOL 6.25 MG PO TABS: 6.2500 mg | ORAL_TABLET | Freq: Two times a day (BID) | ORAL | Status: DC

## 2014-10-16 NOTE — Progress Notes (Signed)
OFFICE NOTE  Chief Complaint:  Follow-up stress test  Primary Care Physician: No PCP Per Patient  HPI:  Cole [name] is a pleasant 44 yo Hispanic male referred by Dr. Jimmy [name] for chest pain. He has a history of hypertension, dyslipidemia and ESRD on HD on M,W,F. He has a fistula in the left forearm through which he is dialyzed. Recently, he has been having episodes of chest pain, worse when doing activities such as swimming that goes across from the left to right side of the chest. He also gets chest pressure while trying to hold his breath, which makes him cough. He has problems with his nostrils as well, saying they often feel clogged. He has recently had increased mucous production.  I had the pleasure seeing [name] back in the office today. He recently underwent nuclear stress testing which was markedly abnormal and determined to be high risk. This demonstrated a medium size, moderate intensity, partially reversible inferior defect; findings consistent with inferior thinning vs prior infarct with minimal inferior ischemia; EF 32% with global hypokinesis and moderate LVE; study high risk due to reduced LV function. I discussed these findings with him today and there was a Patent attorney present. Based on the findings and his recent chest pain symptoms, I'm concerned about obstructive coronary artery disease as the cause of his cardiomyopathy. I'm recommending right and left heart catheterization. I discussed risks and benefits with him in the office today which was translated in Spanish to him and he clearly understood and provided informed consent to proceed with cardiac catheterization.  PMHx:  Past Medical History  Diagnosis Date  . Hypertension   . Chronic kidney disease     Past Surgical History  Procedure Laterality Date  . No past surgeries    . Insertion of dialysis catheter  10/05/2011    Procedure: INSERTION OF DIALYSIS CATHETER;  Surgeon: Rosetta Posner, MD;  Location: [place];  Service: Vascular;  Laterality: N/A;  right internal jugular vein  . Av fistula placement  10/05/2011    Procedure: ARTERIOVENOUS (AV) FISTULA CREATION;  Surgeon: Rosetta Posner, MD;  Location: [facility] OR;  Service: Vascular;  Laterality: Left;    FAMHx:  Family History  Problem Relation Age of Onset  . Hypertension Mother   . Lung cancer Father     SOCHx:   reports that he has never smoked. He has never used smokeless tobacco. He reports that he does not drink alcohol or use illicit drugs.  ALLERGIES:  No Known Allergies  ROS: A comprehensive review of systems was negative except for: Respiratory: positive for cough, dyspnea on exertion and sputum Cardiovascular: positive for exertional chest pressure/discomfort  HOME MEDS: Current Outpatient Prescriptions  Medication Sig Dispense Refill  . allopurinol (ZYLOPRIM) 100 MG tablet Take 200 mg by mouth daily.    Marland Kitchen amLODipine (NORVASC) 10 MG tablet Take 1 tablet (10 mg total) by mouth daily. 30 tablet 0  . aspirin 81 MG tablet Take 81 mg by mouth daily.    . calcium elemental as carbonate (TUMS ULTRA 1000) 400 MG tablet Chew 2,000 mg by mouth 3 (three) times daily with meals.    . clonazePAM (KLONOPIN) 1 MG tablet Take 1 tablet (1 mg total) by mouth daily as needed (cramps). 30 tablet 0  . multivitamin (RENA-VIT) TABS tablet Take 1 tablet by mouth at bedtime.    . simvastatin (ZOCOR) 20 MG tablet Take 20 mg by mouth every evening.    Marland Kitchen  carvedilol (COREG) 6.25 MG tablet Take 1 tablet (6.25 mg total) by mouth 2 (two) times daily. 180 tablet 3   No current facility-administered medications for this visit.    LABS/IMAGING: No results found for this or any previous visit (from the past 48 hour(s)). No results found.  WEIGHTS: Wt Readings from Last 3 Encounters:  10/16/14 135 lb 8 oz (61.462 kg)  10/02/14 136 lb (61.689 kg)  09/20/14 136 lb 11.2 oz (62.007 kg)    VITALS: BP 142/102 mmHg  Pulse 88  Ht 5'  3.5" (1.613 m)  Wt 135 lb 8 oz (61.462 kg)  BMI 23.62 kg/m2  EXAM: Deferred  EKG: Deferred  ASSESSMENT: 1. Acute systolic congestive heart failure-EF 32% 2. High risk nuclear stress test within area of mostly fixed inferior scar with mild ischemia 3. Chest pain and DOE 4. Cough with increased sputum 5. HTN 6. Dyslipidemia 7. ESRD on HD (MWF)  PLAN: 1.   [name] has been having chest pain, DOE (especially when swimming) and fatigue. His nuclear stress test was high risk showing a new cardiomyopathy with what appears to be a fixed inferior scar and some ischemia. This also could represent multivessel coronary disease. He also has had chest pain and shortness of breath recently. He reports his shortness of breath has improved somewhat. I'm recommending heart catheterization. We will schedule that as soon as possible. I also recommend starting low-dose aspirin 81 mg daily and carvedilol 6.25 mg twice a day in addition to his amlodipine. ACE inhibitor or ARB is not indicated due to end-stage renal disease. Volume status is managed by dialysis.  Pixie Casino, MD, [facility] Attending Cardiologist [place] 10/16/2014, 9:38 AM

## 2014-10-16 NOTE — Patient Instructions (Addendum)
Your physician has requested that you have a (right and left) cardiac catheterization @ [facility] on a Tuesday/Thursday with Dr. Debara Pickett. Cardiac catheterization is used to diagnose and/or treat various heart conditions. Doctors may recommend this procedure for a number of different reasons. The most common reason is to evaluate chest pain. Chest pain can be a symptom of coronary artery disease (CAD), and cardiac catheterization can show whether plaque is narrowing or blocking your heart's arteries. This procedure is also used to evaluate the valves, as well as measure the blood flow and oxygen levels in different parts of your heart. For further information please visit [email or web address]. Please follow instruction sheet, as given.  Following your catheterization, you will not be allowed to drive for 3 days.  No lifting, pushing, or pulling greater that 10 pounds is allowed for 1 week.  You will be required to have the following tests prior to the procedure:  1. Blood work - the blood work can be done no more than 7 days prior to the procedure.  It can be done at any [facility] lab. There is a lab downstairs on the first floor of this building in [address] and one at [address].  2. Chest X-ray - this can be done at [place] in the [place] @ [address]   MEDICATION CHANGES 1. START aspirin 81mg  once daily 2. START carvedilol 6.25mg  twice daily

## 2014-10-17 ENCOUNTER — Telehealth: Payer: Self-pay | Admitting: Internal Medicine

## 2014-10-17 ENCOUNTER — Other Ambulatory Visit: Payer: Self-pay | Admitting: *Deleted

## 2014-10-17 DIAGNOSIS — I519 Heart disease, unspecified: Secondary | ICD-10-CM

## 2014-10-17 DIAGNOSIS — R9439 Abnormal result of other cardiovascular function study: Secondary | ICD-10-CM

## 2014-10-17 NOTE — Telephone Encounter (Signed)
Thanks.Marland Kitchen  He is on dialysis.  Dr. Lemmie Evens

## 2014-10-17 NOTE — Telephone Encounter (Signed)
Received a call from [place] with [facility] lab calling to report critical creat 9.20.Message sent to Dr.Hilty.

## 2014-10-17 NOTE — Telephone Encounter (Signed)
Cole [name] is wanting to report critical labs for the pt  Thanks

## 2014-10-23 ENCOUNTER — Encounter (HOSPITAL_COMMUNITY)
Admission: RE | Disposition: A | Discharge status: Home / Self Care | Payer: Self-pay | Source: Ambulatory Visit | Attending: Interventional Cardiology

## 2014-10-23 ENCOUNTER — Ambulatory Visit (HOSPITAL_COMMUNITY)
Admission: RE | Admit: 2014-10-23 | Discharge: 2014-10-23 | Disposition: A | Discharge status: Home / Self Care | Payer: PRIVATE HEALTH INSURANCE | Source: Ambulatory Visit | Attending: Interventional Cardiology | Admitting: Interventional Cardiology

## 2014-10-23 DIAGNOSIS — R0789 Other chest pain: Secondary | ICD-10-CM | POA: Insufficient documentation

## 2014-10-23 DIAGNOSIS — Z7982 Long term (current) use of aspirin: Secondary | ICD-10-CM | POA: Insufficient documentation

## 2014-10-23 DIAGNOSIS — I519 Heart disease, unspecified: Secondary | ICD-10-CM

## 2014-10-23 DIAGNOSIS — I5022 Chronic systolic (congestive) heart failure: Secondary | ICD-10-CM

## 2014-10-23 DIAGNOSIS — Z992 Dependence on renal dialysis: Secondary | ICD-10-CM | POA: Insufficient documentation

## 2014-10-23 DIAGNOSIS — R931 Abnormal findings on diagnostic imaging of heart and coronary circulation: Secondary | ICD-10-CM

## 2014-10-23 DIAGNOSIS — I120 Hypertensive chronic kidney disease with stage 5 chronic kidney disease or end stage renal disease: Secondary | ICD-10-CM | POA: Insufficient documentation

## 2014-10-23 DIAGNOSIS — I10 Essential (primary) hypertension: Secondary | ICD-10-CM | POA: Diagnosis present

## 2014-10-23 DIAGNOSIS — E785 Hyperlipidemia, unspecified: Secondary | ICD-10-CM | POA: Insufficient documentation

## 2014-10-23 DIAGNOSIS — I5020 Unspecified systolic (congestive) heart failure: Secondary | ICD-10-CM | POA: Diagnosis present

## 2014-10-23 DIAGNOSIS — I429 Cardiomyopathy, unspecified: Secondary | ICD-10-CM | POA: Insufficient documentation

## 2014-10-23 DIAGNOSIS — N186 End stage renal disease: Secondary | ICD-10-CM | POA: Insufficient documentation

## 2014-10-23 DIAGNOSIS — R9439 Abnormal result of other cardiovascular function study: Secondary | ICD-10-CM

## 2014-10-23 DIAGNOSIS — I5023 Acute on chronic systolic (congestive) heart failure: Secondary | ICD-10-CM | POA: Insufficient documentation

## 2014-10-23 HISTORY — PX: CARDIAC CATHETERIZATION: SHX172

## 2014-10-23 LAB — POCT I-STAT, CHEM 8
BUN: 56 mg/dL — ABNORMAL HIGH (ref 6–20)
Calcium, Ion: 1.08 mmol/L — ABNORMAL LOW (ref 1.12–1.23)
Chloride: 97 mmol/L — ABNORMAL LOW (ref 101–111)
Creatinine, Ser: 10.3 mg/dL — ABNORMAL HIGH (ref 0.61–1.24)
Glucose, Bld: 104 mg/dL — ABNORMAL HIGH (ref 65–99)
HCT: 45 % (ref 39.0–52.0)
Hemoglobin: 15.3 g/dL (ref 13.0–17.0)
Potassium: 5.4 mmol/L — ABNORMAL HIGH (ref 3.5–5.1)
Sodium: 136 mmol/L (ref 135–145)
TCO2: 28 mmol/L (ref 0–100)

## 2014-10-23 LAB — POCT I-STAT 3, ART BLOOD GAS (G3+)
Acid-Base Excess: 4 mmol/L — ABNORMAL HIGH (ref 0.0–2.0)
Bicarbonate: 29.4 mEq/L — ABNORMAL HIGH (ref 20.0–24.0)
O2 Saturation: 100 %
TCO2: 31 mmol/L (ref 0–100)
pCO2 arterial: 46 mmHg — ABNORMAL HIGH (ref 35.0–45.0)
pH, Arterial: 7.414 (ref 7.350–7.450)
pO2, Arterial: 175 mmHg — ABNORMAL HIGH (ref 80.0–100.0)

## 2014-10-23 LAB — POCT I-STAT 3, VENOUS BLOOD GAS (G3P V)
Acid-Base Excess: 4 mmol/L — ABNORMAL HIGH (ref 0.0–2.0)
Bicarbonate: 30.2 mEq/L — ABNORMAL HIGH (ref 20.0–24.0)
O2 Saturation: 84 %
TCO2: 32 mmol/L (ref 0–100)
pCO2, Ven: 50.7 mmHg — ABNORMAL HIGH (ref 45.0–50.0)
pH, Ven: 7.383 — ABNORMAL HIGH (ref 7.250–7.300)
pO2, Ven: 50 mmHg — ABNORMAL HIGH (ref 30.0–45.0)

## 2014-10-23 SURGERY — RIGHT/LEFT HEART CATH AND CORONARY ANGIOGRAPHY

## 2014-10-23 MED ORDER — SODIUM CHLORIDE 0.9 % IJ SOLN: 3.0000 mL | Freq: Two times a day (BID) | INTRAMUSCULAR | Status: DC

## 2014-10-23 MED ORDER — IOHEXOL 350 MG/ML SOLN
INTRAVENOUS | Status: DC | PRN
  Administered 2014-10-23: 70 mL via INTRA_ARTERIAL

## 2014-10-23 MED ORDER — SODIUM CHLORIDE 0.9 % IV SOLN
INTRAVENOUS | Status: DC | PRN
  Administered 2014-10-23: 10 mL/h via INTRAVENOUS

## 2014-10-23 MED ORDER — SODIUM CHLORIDE 0.9 % IV SOLN: 250.0000 mL | INTRAVENOUS | Status: DC | PRN

## 2014-10-23 MED ORDER — FENTANYL CITRATE (PF) 100 MCG/2ML IJ SOLN
INTRAMUSCULAR | Status: AC
  Filled 2014-10-23: qty 4

## 2014-10-23 MED ORDER — SODIUM CHLORIDE 0.9 % IV SOLN: INTRAVENOUS | Status: DC

## 2014-10-23 MED ORDER — MIDAZOLAM HCL 2 MG/2ML IJ SOLN
INTRAMUSCULAR | Status: AC
  Filled 2014-10-23: qty 4

## 2014-10-23 MED ORDER — ASPIRIN 81 MG PO CHEW
81.0000 mg | CHEWABLE_TABLET | ORAL | Status: AC
  Administered 2014-10-23: 81 mg via ORAL

## 2014-10-23 MED ORDER — HEPARIN (PORCINE) IN NACL 2-0.9 UNIT/ML-% IJ SOLN
INTRAMUSCULAR | Status: AC
  Filled 2014-10-23: qty 1500

## 2014-10-23 MED ORDER — SODIUM CHLORIDE 0.9 % IJ SOLN: 3.0000 mL | INTRAMUSCULAR | Status: DC | PRN

## 2014-10-23 MED ORDER — MIDAZOLAM HCL 2 MG/2ML IJ SOLN
INTRAMUSCULAR | Status: DC | PRN
  Administered 2014-10-23: 1 mg via INTRAVENOUS

## 2014-10-23 MED ORDER — LIDOCAINE HCL (PF) 1 % IJ SOLN
INTRAMUSCULAR | Status: DC | PRN
  Administered 2014-10-23: 08:00:00

## 2014-10-23 MED ORDER — ONDANSETRON HCL 4 MG/2ML IJ SOLN: 4.0000 mg | Freq: Four times a day (QID) | INTRAMUSCULAR | Status: DC | PRN

## 2014-10-23 MED ORDER — FENTANYL CITRATE (PF) 100 MCG/2ML IJ SOLN
INTRAMUSCULAR | Status: DC | PRN
  Administered 2014-10-23: 50 ug via INTRAVENOUS

## 2014-10-23 MED ORDER — ACETAMINOPHEN 325 MG PO TABS: 650.0000 mg | ORAL_TABLET | ORAL | Status: DC | PRN

## 2014-10-23 MED ORDER — LIDOCAINE HCL (PF) 1 % IJ SOLN
INTRAMUSCULAR | Status: AC
  Filled 2014-10-23: qty 30

## 2014-10-23 MED ORDER — NITROGLYCERIN 1 MG/10 ML FOR IR/CATH LAB
INTRA_ARTERIAL | Status: AC
  Filled 2014-10-23: qty 10

## 2014-10-23 MED ORDER — ASPIRIN 81 MG PO CHEW
CHEWABLE_TABLET | ORAL | Status: DC
Start: 2014-10-23 — End: 2014-10-23
  Filled 2014-10-23: qty 1

## 2014-10-23 SURGICAL SUPPLY — 10 items
CATH SITESEER 5F MULTI A 2 (CATHETERS) ×1 IMPLANT
CATH SWAN GANZ 7F STRAIGHT (CATHETERS) ×1 IMPLANT
KIT HEART LEFT (KITS) ×2 IMPLANT
PACK CARDIAC CATHETERIZATION (CUSTOM PROCEDURE TRAY) ×2 IMPLANT
SHEATH PINNACLE 5F 10CM (SHEATH) ×1 IMPLANT
SHEATH PINNACLE 7F 10CM (SHEATH) ×2 IMPLANT
STOPCOCK MORSE 400PSI 3WAY (MISCELLANEOUS) ×2 IMPLANT
TRANSDUCER W/STOPCOCK (MISCELLANEOUS) ×4 IMPLANT
TUBING ART PRESS 72  MALE/MALE (TUBING) ×1 IMPLANT
WIRE EMERALD 3MM-J .035X150CM (WIRE) ×2 IMPLANT

## 2014-10-23 NOTE — Interval H&P Note (Signed)
Cath Lab Visit (complete for each Cath Lab visit)  Clinical Evaluation Leading to the Procedure:   ACS: No.  Non-ACS:    Anginal Classification: CCS III  Anti-ischemic medical therapy: Minimal Therapy (1 class of medications)  Non-Invasive Test Results: High-risk stress test findings: cardiac mortality >3%/year  Prior CABG: No previous CABG      History and Physical Interval Note:  10/23/2014 6:48 AM  Cole [name]  has presented today for surgery, with the diagnosis of cp  The various methods of treatment have been discussed with the patient and family. After consideration of risks, benefits and other options for treatment, the patient has consented to  Procedure(s): Right/Left Heart Cath and Coronary Angiography (N/A) as a surgical intervention .  The patient's history has been reviewed, patient examined, no change in status, stable for surgery.  I have reviewed the patient's chart and labs.  Questions were answered to the patient's satisfaction.     Sinclair Grooms

## 2014-10-23 NOTE — Progress Notes (Signed)
Interpreter Lesle Chris for pre cath lab

## 2014-10-23 NOTE — Discharge Instructions (Signed)
Angiografa: cuidados posteriores (Angiogram, Care After) Siga estas instrucciones durante las prximas semanas. Estas indicaciones le proporcionan informacin general acerca de cmo deber cuidarse despus del procedimiento. El mdico tambin podr darle instrucciones ms especficas. El tratamiento ha sido planificado segn las prcticas mdicas actuales, pero en algunos casos pueden ocurrir problemas. Comunquese con el mdico si tiene algn problema o tiene dudas despus del procedimiento.  QU ESPERAR DESPUS DEL PROCEDIMIENTO Despus del procedimiento, es normal tener:  Pequeas molestias o sensibilidad y un pequeo bulto en el lugar de la insercin del catter. En 1 o 2 semanas debe disminuir el tamao y la inflamacin del bulto.  Algn hematoma que generalmente desaparece despus de 2 a 4 semanas. INSTRUCCIONES PARA EL CUIDADO EN EL HOGAR   Posiblemente deba seguir tomando anticoagulantes, si se los recetaron. Tome los medicamentos solamente como se lo haya indicado el mdico.  No se aplique talcos ni lociones en el sitio.  No tome baos de inmersin, no practique natacin ni utilice el jacuzzi hasta que el mdico lo autorice.  Podr darse Cole [name] ducha 24 horas despus del procedimiento. Retire el vendaje (apsito) y limpie suavemente el lugar de la insercin con agua y jabn comn. Seque con golpecitos suaves.  Inspeccione [name] por da.  Limite sus actividades durante las primeras 48 horas. No se incline, no se ponga en cuclillas ni levante objetos que pesen ms de 20lb (9kg) o lo que le indique el mdico.  Haga planes para que una persona lo lleve de vuelta a su casa despus del procedimiento. Siga las instrucciones acerca de cundo volver a Forensic psychologist o Location manager a Fish farm manager. SOLICITE ATENCIN MDICA SI:  Se siente mareado al pararse.  Tiene un drenaje (que no es una pequea cantidad de sangre en el vendaje).  Tiene escalofros.  Tiene fiebre.  Observa  irritacin, calor, hinchazn o dolor en el lugar de la insercin. SOLICITE ATENCIN MDICA DE INMEDIATO SI:   Comienza a sentir dolor en el pecho, le falta el aire, se siente mareado o se desmaya.  Hay sangrado, hinchazn ms grande que una nuez o drenaje en el lugar de la insercin del catter.  Siente dolor, observa un cambio en la coloracin, enfriamiento o un hematoma importante en la pierna o en el brazo donde el catter fue insertado.  Cole [name] en algn otro lugar, como en los intestinos. Puede ser que observe sangre roja brillante en la orina o en las heces, o que estas sean de color negro y aspecto alquitranado.  Sangra mucho en el lugar de la insercin. Si esto ocurre, haga presin en el lugar de la insercin. ASEGRESE DE QUE:  Comprende estas instrucciones.  Controlar su afeccin.  Recibir ayuda de inmediato si no mejora o si empeora. Document Released: 04/10/2008 Document Revised: 05/29/2013 [facility] Patient Information 2015 [place]. This information is not intended to replace advice given to you by your health care provider. Make sure you discuss any questions you have with your health care provider. Venograma: cuidados posteriores (Venogram, Care After) Siga estas instrucciones durante las prximas semanas. Estas indicaciones le proporcionan informacin general acerca de cmo deber cuidarse despus del procedimiento. El mdico tambin podr darle instrucciones ms especficas. El tratamiento ha sido planificado segn las prcticas mdicas actuales, pero en algunos casos pueden ocurrir problemas. Comunquese con el mdico si tiene algn problema o tiene dudas despus del procedimiento. QU ESPERAR DESPUS DEL PROCEDIMIENTO Despus del procedimiento, es normal:  Sentir una leve incomodidad en TEFL teacher  de insercin del catter. [place] todos los medicamentos exactamente como se le indic.  Siga la dieta que le han  indicado.  Siga las instrucciones con respecto al descanso y a la actividad fsica.  Beba ms lquido durante los [employer] al procedimiento para Counselling psychologist sustancia de contraste de los riones. SOLICITE ATENCIN MDICA SI:  Le aparece una erupcin cutnea.  Tiene fiebre que no puede bajar con los [employer]. SOLICITE ATENCIN MDICA DE INMEDIATO SI:  Hay dolor, supuracin, hemorragia, enrojecimiento, hinchazn, calor o una lnea roja en el lugar donde se coloc la va intravenosa (IV).  La extremidad donde se coloc la va intravenosa pierde color, se adormece o se enfra.  Tiene dificultad para respirar o Risk manager.  Siente dolor en el pecho.  Tiene mareos o [employer]. Document Released: 11/02/2012 Document Revised: 01/17/2013 [facility] Patient Information 2015 [place]. This information is not intended to replace advice given to you by your health care provider. Make sure you discuss any questions you have with your health care provider.                                      Return To Work __________[name]________________________________________ was treated at our facility. 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 Your employer should be aware of your condition and can help with the necessary work activity restrictions. If you wish to return  to work sooner than the date above, or if you have further problems which make it difficult for you to return at that time, please call us or your caregiver.     _________________________________________ Physician Name (Printed)    _________________________________________ Physician Signature     _________________________________________ Date Document Released: 01/12/2005 Document Revised: 04/06/2011 Document Reviewed: 06/29/2006 [employer] Patient Information 2015 [place], [place]. This information is not intended to replace advice given to you by your health care provider. Make sure you discuss any questions you have with your health care provider.

## 2014-10-23 NOTE — Progress Notes (Signed)
Site area: Rt fem art and rt fem vein  Site Prior to Removal:  Level 0 Pressure Applied For: 20 min Manual:   Manual hold Patient Status During Pull:  A/o Post Pull Site:  Level 0 Post Pull Instructions Given:  Post instructions were given and translated through a Spanish interpreter Post Pull Pulses Present: Rt dp/pt 2+ Dressing Applied:  Tegaderm applied Bedrest begins @  09:20  Pt leaves ha in stable condition. Rt groin unremarkable. Dressing applied is CDI.

## 2014-10-23 NOTE — Progress Notes (Signed)
Interpreter Lesle Chris for Recovery area.

## 2014-10-23 NOTE — H&P (View-Only) (Signed)
OFFICE NOTE  Chief Complaint:  Follow-up stress test  Primary Care Physician: No PCP Per Patient  HPI:  Cole [name] is a pleasant 44 yo Hispanic male referred by Dr. Jimmy [name] for chest pain. He has a history of hypertension, dyslipidemia and ESRD on HD on M,W,F. He has a fistula in the left forearm through which he is dialyzed. Recently, he has been having episodes of chest pain, worse when doing activities such as swimming that goes across from the left to right side of the chest. He also gets chest pressure while trying to hold his breath, which makes him cough. He has problems with his nostrils as well, saying they often feel clogged. He has recently had increased mucous production.  I had the pleasure seeing [name] back in the office today. He recently underwent nuclear stress testing which was markedly abnormal and determined to be high risk. This demonstrated a medium size, moderate intensity, partially reversible inferior defect; findings consistent with inferior thinning vs prior infarct with minimal inferior ischemia; EF 32% with global hypokinesis and moderate LVE; study high risk due to reduced LV function. I discussed these findings with him today and there was a Patent attorney present. Based on the findings and his recent chest pain symptoms, I'm concerned about obstructive coronary artery disease as the cause of his cardiomyopathy. I'm recommending right and left heart catheterization. I discussed risks and benefits with him in the office today which was translated in Spanish to him and he clearly understood and provided informed consent to proceed with cardiac catheterization.  PMHx:  Past Medical History  Diagnosis Date  . Hypertension   . Chronic kidney disease     Past Surgical History  Procedure Laterality Date  . No past surgeries    . Insertion of dialysis catheter  10/05/2011    Procedure: INSERTION OF DIALYSIS CATHETER;  Surgeon: Rosetta Posner, MD;  Location: [place];  Service: Vascular;  Laterality: N/A;  right internal jugular vein  . Av fistula placement  10/05/2011    Procedure: ARTERIOVENOUS (AV) FISTULA CREATION;  Surgeon: Rosetta Posner, MD;  Location: [facility] OR;  Service: Vascular;  Laterality: Left;    FAMHx:  Family History  Problem Relation Age of Onset  . Hypertension Mother   . Lung cancer Father     SOCHx:   reports that he has never smoked. He has never used smokeless tobacco. He reports that he does not drink alcohol or use illicit drugs.  ALLERGIES:  No Known Allergies  ROS: A comprehensive review of systems was negative except for: Respiratory: positive for cough, dyspnea on exertion and sputum Cardiovascular: positive for exertional chest pressure/discomfort  HOME MEDS: Current Outpatient Prescriptions  Medication Sig Dispense Refill  . allopurinol (ZYLOPRIM) 100 MG tablet Take 200 mg by mouth daily.    Marland Kitchen amLODipine (NORVASC) 10 MG tablet Take 1 tablet (10 mg total) by mouth daily. 30 tablet 0  . aspirin 81 MG tablet Take 81 mg by mouth daily.    . calcium elemental as carbonate (TUMS ULTRA 1000) 400 MG tablet Chew 2,000 mg by mouth 3 (three) times daily with meals.    . clonazePAM (KLONOPIN) 1 MG tablet Take 1 tablet (1 mg total) by mouth daily as needed (cramps). 30 tablet 0  . multivitamin (RENA-VIT) TABS tablet Take 1 tablet by mouth at bedtime.    . simvastatin (ZOCOR) 20 MG tablet Take 20 mg by mouth every evening.    Marland Kitchen  carvedilol (COREG) 6.25 MG tablet Take 1 tablet (6.25 mg total) by mouth 2 (two) times daily. 180 tablet 3   No current facility-administered medications for this visit.    LABS/IMAGING: No results found for this or any previous visit (from the past 48 hour(s)). No results found.  WEIGHTS: Wt Readings from Last 3 Encounters:  10/16/14 135 lb 8 oz (61.462 kg)  10/02/14 136 lb (61.689 kg)  09/20/14 136 lb 11.2 oz (62.007 kg)    VITALS: BP 142/102 mmHg  Pulse 88  Ht 5'  3.5" (1.613 m)  Wt 135 lb 8 oz (61.462 kg)  BMI 23.62 kg/m2  EXAM: Deferred  EKG: Deferred  ASSESSMENT: 1. Acute systolic congestive heart failure-EF 32% 2. High risk nuclear stress test within area of mostly fixed inferior scar with mild ischemia 3. Chest pain and DOE 4. Cough with increased sputum 5. HTN 6. Dyslipidemia 7. ESRD on HD (MWF)  PLAN: 1.   Cole [name] has been having chest pain, DOE (especially when swimming) and fatigue. His nuclear stress test was high risk showing a new cardiomyopathy with what appears to be a fixed inferior scar and some ischemia. This also could represent multivessel coronary disease. He also has had chest pain and shortness of breath recently. He reports his shortness of breath has improved somewhat. I'm recommending heart catheterization. We will schedule that as soon as possible. I also recommend starting low-dose aspirin 81 mg daily and carvedilol 6.25 mg twice a day in addition to his amlodipine. ACE inhibitor or ARB is not indicated due to end-stage renal disease. Volume status is managed by dialysis.  Pixie Casino, MD, [facility] Attending Cardiologist Letts C Hilty 10/16/2014, 9:38 AM

## 2014-10-24 ENCOUNTER — Encounter (HOSPITAL_COMMUNITY): Payer: Self-pay | Admitting: Interventional Cardiology

## 2014-10-25 ENCOUNTER — Other Ambulatory Visit: Payer: Self-pay

## 2014-10-25 ENCOUNTER — Ambulatory Visit: Payer: No Typology Code available for payment source | Admitting: Internal Medicine

## 2014-10-25 DIAGNOSIS — N184 Chronic kidney disease, stage 4 (severe): Secondary | ICD-10-CM

## 2014-10-25 DIAGNOSIS — T82511A Breakdown (mechanical) of surgically created arteriovenous shunt, initial encounter: Secondary | ICD-10-CM

## 2014-10-29 ENCOUNTER — Ambulatory Visit: Payer: No Typology Code available for payment source | Admitting: Internal Medicine

## 2014-10-30 ENCOUNTER — Ambulatory Visit (HOSPITAL_COMMUNITY)
Admission: RE | Admit: 2014-10-30 | Discharge: 2014-10-30 | Disposition: A | Discharge status: Home / Self Care | Payer: Self-pay | Source: Ambulatory Visit | Attending: Vascular Surgery | Admitting: Vascular Surgery

## 2014-10-30 ENCOUNTER — Ambulatory Visit: Payer: No Typology Code available for payment source | Admitting: Internal Medicine

## 2014-10-30 DIAGNOSIS — T82511A Breakdown (mechanical) of surgically created arteriovenous shunt, initial encounter: Secondary | ICD-10-CM

## 2014-10-30 DIAGNOSIS — T82511D Breakdown (mechanical) of surgically created arteriovenous shunt, subsequent encounter: Secondary | ICD-10-CM | POA: Insufficient documentation

## 2014-10-30 DIAGNOSIS — I728 Aneurysm of other specified arteries: Secondary | ICD-10-CM | POA: Insufficient documentation

## 2014-10-30 DIAGNOSIS — Y838 Other surgical procedures as the cause of abnormal reaction of the patient, or of later complication, without mention of misadventure at the time of the procedure: Secondary | ICD-10-CM | POA: Insufficient documentation

## 2014-11-07 ENCOUNTER — Emergency Department (HOSPITAL_COMMUNITY)
Admission: EM | Admit: 2014-11-07 | Discharge: 2014-11-08 | Disposition: A | Discharge status: Home / Self Care | Payer: Self-pay | Attending: Emergency Medicine | Admitting: Emergency Medicine

## 2014-11-07 ENCOUNTER — Emergency Department (HOSPITAL_COMMUNITY): Payer: Self-pay

## 2014-11-07 ENCOUNTER — Encounter (HOSPITAL_COMMUNITY): Payer: Self-pay | Admitting: *Deleted

## 2014-11-07 DIAGNOSIS — Z7982 Long term (current) use of aspirin: Secondary | ICD-10-CM | POA: Insufficient documentation

## 2014-11-07 DIAGNOSIS — Z992 Dependence on renal dialysis: Secondary | ICD-10-CM | POA: Insufficient documentation

## 2014-11-07 DIAGNOSIS — R002 Palpitations: Secondary | ICD-10-CM | POA: Insufficient documentation

## 2014-11-07 DIAGNOSIS — Z9889 Other specified postprocedural states: Secondary | ICD-10-CM | POA: Insufficient documentation

## 2014-11-07 DIAGNOSIS — Z79899 Other long term (current) drug therapy: Secondary | ICD-10-CM | POA: Insufficient documentation

## 2014-11-07 DIAGNOSIS — R079 Chest pain, unspecified: Secondary | ICD-10-CM | POA: Insufficient documentation

## 2014-11-07 DIAGNOSIS — Z8719 Personal history of other diseases of the digestive system: Secondary | ICD-10-CM | POA: Insufficient documentation

## 2014-11-07 DIAGNOSIS — E7800 Pure hypercholesterolemia, unspecified: Secondary | ICD-10-CM | POA: Insufficient documentation

## 2014-11-07 DIAGNOSIS — I120 Hypertensive chronic kidney disease with stage 5 chronic kidney disease or end stage renal disease: Secondary | ICD-10-CM | POA: Insufficient documentation

## 2014-11-07 DIAGNOSIS — N186 End stage renal disease: Secondary | ICD-10-CM | POA: Insufficient documentation

## 2014-11-07 DIAGNOSIS — R000 Tachycardia, unspecified: Secondary | ICD-10-CM | POA: Insufficient documentation

## 2014-11-07 HISTORY — DX: Gastro-esophageal reflux disease without esophagitis: K21.9

## 2014-11-07 HISTORY — DX: Pure hypercholesterolemia, unspecified: E78.00

## 2014-11-07 HISTORY — DX: Unspecified kidney failure: N19

## 2014-11-07 LAB — BASIC METABOLIC PANEL
Anion gap: 16 — ABNORMAL HIGH (ref 5–15)
BUN: 57 mg/dL — ABNORMAL HIGH (ref 6–20)
CO2: 27 mmol/L (ref 22–32)
Calcium: 9.4 mg/dL (ref 8.9–10.3)
Chloride: 94 mmol/L — ABNORMAL LOW (ref 101–111)
Creatinine, Ser: 8.47 mg/dL — ABNORMAL HIGH (ref 0.61–1.24)
GFR calc Af Amer: 8 mL/min — ABNORMAL LOW (ref >60)
GFR calc non Af Amer: 7 mL/min — ABNORMAL LOW (ref >60)
Glucose, Bld: 133 mg/dL — ABNORMAL HIGH (ref 65–99)
Potassium: 5.2 mmol/L — ABNORMAL HIGH (ref 3.5–5.1)
Sodium: 137 mmol/L (ref 135–145)

## 2014-11-07 LAB — CBC
HCT: 43.6 % (ref 39.0–52.0)
Hemoglobin: 14.2 g/dL (ref 13.0–17.0)
MCH: 29.3 pg (ref 26.0–34.0)
MCHC: 32.6 g/dL (ref 30.0–36.0)
MCV: 89.9 fL (ref 78.0–100.0)
Platelets: 155 10*3/uL (ref 150–400)
RBC: 4.85 MIL/uL (ref 4.22–5.81)
RDW: 14.7 % (ref 11.5–15.5)
WBC: 4.9 10*3/uL (ref 4.0–10.5)

## 2014-11-07 LAB — I-STAT TROPONIN, ED: Troponin i, poc: 0.01 ng/mL (ref 0.00–0.08)

## 2014-11-07 NOTE — ED Notes (Signed)
Pt states that he finished dialysis 3 hrs ago; pt states that he began to have chest pain and heart palpitations after that; pt states that it feels like his heart speeds up every 10-15 secs; pt c/o midsternal chest pain that feels like something is stuck in there; pt states that he just had a recent cardiac cath and Stress test; pt denies shortness of breath but c/o nausea

## 2014-11-07 NOTE — ED Provider Notes (Signed)
CSN: [id number]     Arrival date & time 11/07/14  2136 History  By signing my name below, I, Hilda Lias, attest that this documentation has been prepared under the direction and in the presence of Varney Biles, MD. Electronically Signed: Hilda Lias, ED Scribe. 11/07/2014. 11:42 PM.    Chief Complaint  Patient presents with  . Chest Pain  . Palpitations      The history is provided by the patient. No language interpreter was used.   HPI Comments: Cole [name] is a 44 y.o. male who presents to the Emergency Department complaining of intermittent palpitatations episodes with associated central chest pain and bilateral weakness in his legs that has been present for three days. Pt reports he has tachycardia randomly associated with central chest pain for 10-15 seconds at a time, and then his heart rate goes back to normal. Pt states that these symptoms worsen with movement. Pt states his chest pain sometimes radiates to the left side of his chest. Pt reports that his Potassium was high today while he was doing his dialysis treatment. Pt states he received dialysis three times per week. Pt also reports having near-syncopal episodes intermittently over the past couple of days as well. Pt states that since Sunday, he has had 2-3 episodes where his legs gave out. He typically notices that his legs are weak after he has been sitting for a bit to rest. On one isntance his brother had to help him move around. His symptoms last for 5 min-20 min. Pt states he has never had any symptoms or problems like this before. Pt denies diaphoresis or nausea.  No back pain. No drug use.     Past Medical History  Diagnosis Date  . Hypertension   . Chronic kidney disease   . Renal failure     on dialysis  . Hypercholesteremia   . GERD (gastroesophageal reflux disease)    Past Surgical History  Procedure Laterality Date  . No past surgeries    . Insertion of dialysis catheter  10/05/2011     Procedure: INSERTION OF DIALYSIS CATHETER;  Surgeon: Rosetta Posner, MD;  Location: [place];  Service: Vascular;  Laterality: N/A;  right internal jugular vein  . Av fistula placement  10/05/2011    Procedure: ARTERIOVENOUS (AV) FISTULA CREATION;  Surgeon: Rosetta Posner, MD;  Location: [facility] OR;  Service: Vascular;  Laterality: Left;  . Cardiac catheterization N/A 10/23/2014    Procedure: Right/Left Heart Cath and Coronary Angiography;  Surgeon: Belva Crome, MD;  Location: [place] [facility];  Service: Cardiovascular;  Laterality: N/A;   Family History  Problem Relation Age of Onset  . Hypertension Mother   . Lung cancer Father    Social History  Substance Use Topics  . Smoking status: Never Smoker   . Smokeless tobacco: Never Used  . Alcohol Use: No    Review of Systems  Constitutional: Negative for diaphoresis.  Cardiovascular: Positive for chest pain.  Gastrointestinal: Negative for nausea.  Neurological: Positive for weakness.  All other systems reviewed and are negative.     Allergies  Review of patient's allergies indicates no known allergies.  Home Medications   Prior to Admission medications   Medication Sig Start Date End Date Taking? Authorizing Provider  allopurinol (ZYLOPRIM) 100 MG tablet Take 200 mg by mouth daily.   Yes Historical Provider, MD  amLODipine (NORVASC) 10 MG tablet Take 1 tablet (10 mg total) by mouth daily. 10/14/13  Yes Noland Fordyce,  PA-C  aspirin 81 MG tablet Take 81 mg by mouth daily.   Yes Historical Provider, MD  calcium elemental as carbonate (TUMS ULTRA 1000) 400 MG tablet Chew 2,000 mg by mouth 3 (three) times daily with meals.   Yes Historical Provider, MD  carvedilol (COREG) 6.25 MG tablet Take 1 tablet (6.25 mg total) by mouth 2 (two) times daily. 10/16/14  Yes Pixie Casino, MD  multivitamin (RENA-VIT) TABS tablet Take 1 tablet by mouth at bedtime. 10/10/11  Yes Ernest Haber, PA-C  simvastatin (ZOCOR) 20 MG tablet Take 20 mg by mouth every  evening.   Yes Historical Provider, MD  Vitamin D, Cholecalciferol, 1000 UNITS CAPS Take 1 capsule by mouth 3 (three) times a week. MWF when he goes to dialysis   Yes Historical Provider, MD   BP 151/87 mmHg  Pulse 74  Temp(Src) 98.3 F (36.8 C) (Oral)  Resp 20  Ht 5\' 5"  (1.651 m)  Wt 136 lb (61.689 kg)  BMI 22.63 kg/m2  SpO2 97% Physical Exam  Cardiovascular: Normal rate, regular rhythm and intact distal pulses.   Pulmonary/Chest:  Lungs clear to auscultation bilaterally   Abdominal:  No palpable mass nor tenderness to palpation  Musculoskeletal:  Pulses 2+ bilaterally and equal in the radial and femoral region.  Pt has tenderness over the lumbar region No step offs, no erythema. Pt has 2+ patellar reflex bilaterally. Able to discriminate between sharp and dull. Able to ambulate   Skin: Skin is warm and dry.  Nursing note and vitals reviewed.   ED Course  Procedures (including critical care time)  DIAGNOSTIC STUDIES: Oxygen Saturation is 100% on room air, normal by my interpretation.    COORDINATION OF CARE: 11:39 PM Discussed treatment plan with pt at bedside and pt agreed to plan.   Labs Review Labs Reviewed  BASIC METABOLIC PANEL - Abnormal; Notable for the following:    Potassium 5.2 (*)    Chloride 94 (*)    Glucose, Bld 133 (*)    BUN 57 (*)    Creatinine, Ser 8.47 (*)    GFR calc non Af Amer 7 (*)    GFR calc Af Amer 8 (*)    Anion gap 16 (*)    All other components within normal limits  CBC  I-STAT TROPOININ, ED    Imaging Review Dg Chest 2 View  11/07/2014  CLINICAL DATA:  44 year old male with chest pain and tachycardia for 3 days. Dialysis patient. Abnormal potassium. Initial encounter. EXAM: CHEST  2 VIEW COMPARISON:  09/2014 and earlier. FINDINGS: Normal lung volumes. Stable cardiac size at the upper limits of normal to mildly enlarged. Other mediastinal contours are within normal limits. Visualized tracheal air column is within normal limits.  Stable calcified granuloma in the left upper lobe. No pneumothorax, pulmonary edema, pleural effusion or acute pulmonary opacity. No acute osseous abnormality identified. IMPRESSION: No acute cardiopulmonary abnormality. Electronically Signed   By: Genevie Ann M.D.   On: 11/07/2014 22:37   I have personally reviewed and evaluated these images and lab results as part of my medical decision-making.   EKG Interpretation   Date/Time:  Wednesday November 07 2014 21:56:12 EDT Ventricular Rate:  69 PR Interval:  168 QRS Duration: 95 QT Interval:  449 QTC Calculation: 481 R Axis:   52 Text Interpretation:  Sinus rhythm Probable left atrial enlargement Left  ventricular hypertrophy Nonspecific T abnormalities, lateral leads  Borderline prolonged QT interval No significant change since last tracing  Confirmed by  JACUBOWITZ  MD, Inocente Salles [phone]) on 11/07/2014 10:27:12 PM      MDM   Final diagnoses:  Palpitations    I personally performed the services described in this documentation, which was scribed in my presence. The recorded information has been reviewed and is accurate.  Pt comes in with cc of palpitations and leg weakness bilaterally.  Palpitations - associated chest discomfort, intermittent - lasting just a few seconds. Will monitor closely. His HR is normal here.  Weakness - no back pain, intermittent. Seems like a proximal muscle issue. Will ask him for a neuro f/u. Advised to take good noted on the events so that the f/u physician can get more directed workup started.  SW consulted to see if he can Holter monitoring or a close f/u with [facility]. [phone] is the home phone #.  Varney Biles, MD 11/08/14 [phone]

## 2014-11-07 NOTE — ED Notes (Signed)
Pt states that he couldn't walk on Sunday and ws having leg and arm pain; pt states that he was advised at Dialysis that he Calcium was high and that is what caused that

## 2014-11-07 NOTE — ED Notes (Signed)
Pharmacy currently in room with pt, delay in blood draw.

## 2014-11-08 ENCOUNTER — Encounter: Payer: Self-pay | Admitting: Vascular Surgery

## 2014-11-08 MED ORDER — GI COCKTAIL ~~LOC~~
30.0000 mL | Freq: Once | ORAL | Status: AC
  Administered 2014-11-08: 30 mL via ORAL
  Filled 2014-11-08: qty 30

## 2014-11-08 NOTE — ED Notes (Signed)
Patient states he has dialysis on M/W/F. Went to dialysis today. Patient denies chest pain but states that @ 1 hour ago he began having the feeling of "something being stuck" in his chest. States it feels like food. Patient also states his stomach feels hot.

## 2014-11-08 NOTE — Discharge Instructions (Signed)
We saw you in the ER for the palpitations. ER results and monitoring are normal.  We are not sure what is causing your symptoms. Call the [facility] doctor for the palpitation and the neurologist for the weakness. The workup in the ER is not complete, and is limited to screening for life threatening and emergent conditions only, so please see a primary care doctor for further evaluation.   Monitor Holter (Holter Monitoring) El monitor Holter es un pequeo dispositivo que se [place] para detectar el ritmo cardaco anormal. Se lo sujeta a la ropa y, [place] cables, se lo conecta a discos planos Therapist, music (electrodos) que se Therapist, music. Se lleva puesto de manera ininterrumpida durante 24 a 48horas. INSTRUCCIONES PARA EL CUIDADO EN EL HOGAR  Lleve puesto el monitor Holter en todo momento, incluso mientras hace ejercicio y duerme, durante el tiempo que el mdico se lo haya indicado.  Asegrese de que el monitor Holter est bien sujeto a la ropa o cerca del cuerpo, como el mdico lo haya recomendado.  No moje el monitor ni los cables.  No no se aplique locin ni humectante para el cuerpo en el pecho.  Mantenga la piel seca.  Lleve un diario de las actividades cotidianas, por ejemplo, caminar y Optometrist las tareas diarias. Si siente que el ritmo cardaco es anormal o que el corazn aletea o se saltea latidos:  Registre lo que est haciendo cuando esto sucede.  Registre la hora del da a la que se presentan los sntomas.  Devuelva el monitor Holter como se lo haya indicado el mdico.  Concurra a todas las visitas de control como se lo haya indicado el mdico. Esto es importante. SOLICITE ATENCIN MDICA DE INMEDIATO SI:  Se siente mareado o se desmaya.  Tiene dificultad para respirar.  Tiene dolor en el pecho, la parte superior del brazo o la [place] de los [place].  Tiene Higher education careers adviser y la piel est plida, fra o hmeda.  Siente que la frecuencia cardaca no es la habitual o es  anormal.   Esta informacin no tiene Marine scientist el consejo del mdico. Asegrese de hacerle al mdico cualquier pregunta que tenga.   Document Released: 11/09/2008 Document Revised: 02/02/2014 Elsevier Interactive Patient Education 2016 [place] (Palpitations) Es la sensacin de sentir que el latido cardaco es irregular o es ms rpido que lo normal. Se siente como un aleteo o que falta un latido. Generalmente no es un problema grave. Sin embargo, en algunos casos podra ser necesario hacer ms estudios diagnsticos. CAUSAS  Las causas de las palpitaciones pueden ser:  Georgiann Hahn.  El consumo de cafena u otros estimulantes, como pastillas para Horticulturist, commercial o bebidas energizantes.  Alcohol.  Situaciones de estrs y [place].  La actividad fsica extenuante.  Fatiga.  Algunos medicamentos.  Enfermedad cardaca, especialmente si tiene antecedentes de ritmo cardaco irregular (arritmia), como fibrilacin auricular, aleteo auricular o taquicardia supraventricular.  El uso incorrecto de un marcapasos o Estate agent. DIAGNSTICO  Para hallar la causa de las palpitaciones, el mdico le har una historia clnica y un examen fsico. El mdico tambin puede hacerle un estudio llamado electrocardiograma (ECG) ambulatorio. El ECG registra el patrn de los latidos cardacos durante un perodo de 24horas. Tambin pueden hacerle otros estudios, por ejemplo:  Ecocardiograma transtorcico (ETT). Durante IT trainer, se usan ondas sonoras para evaluar cmo fluye la sangre por el corazn.  Ecocardiograma transesofgico (ETE).  Monitoreo cardaco. Este estudio permite que el mdico controle la frecuencia y el ritmo cardaco  en tiempo real.  Monitor Holter. Es un dispositivo porttil que [employer] latidos cardacos y [place] a Retail buyer las arritmias cardacas. Le permite al [employer] registrar la actividad [place], si es necesario.  Pruebas de  estrs por ejercicio o por medicamentos que aceleran los latidos cardacos. [place] palpitaciones depende de la causa y puede variar mucho. En la [employer] no se requiere otro tratamiento que esperar, Nurse, children's y Magazine features editor los sntomas. Otras causas, como la fibrilacin auricular, el aleteo auricular o la taquicardia supraventricular generalmente requieren Lexicographer. INSTRUCCIONES PARA EL CUIDADO EN EL HOGAR   Evite:  Bebidas que contengan cafena como el caf, el t, los refrescos, las pastillas para Horticulturist, commercial y las bebidas energizantes.  Chocolate.  Alcohol.  Si fuma, abandone el hbito.  Reduzca los niveles de estrs y [place]. Algunas cosas que pueden ayudarlo a relajarse son:  Un mtodo para controlar el cuerpo con la mente, por ejemplo, controlar los latidos (biorregulacin).  El yoga.  La meditacin.  La actividad fsica como natacin, trote o caminatas.  Descanse y duerma lo suficiente. SOLICITE ATENCIN MDICA SI:   Contina con latidos cardacos rpidos o irregulares durante ms de 24 horas.  Las [employer] suceden con ms frecuencia. SOLICITE ATENCIN MDICA DE INMEDIATO SI:  Siente falta de aire o dolor en el pecho.  Sufre un dolor intenso de [place].  Se siente mareado o se desmaya. ASEGRESE DE QUE:  Comprende estas instrucciones.  Controlar su afeccin.  Recibir ayuda de inmediato si no mejora o si empeora.   Esta informacin no tiene Marine scientist el consejo del mdico. Asegrese de hacerle al mdico cualquier pregunta que tenga.   Document Released: 10/22/2004 Document Revised: 01/17/2013 Elsevier Interactive Patient Education [employer].

## 2014-11-09 ENCOUNTER — Ambulatory Visit (HOSPITAL_COMMUNITY)
Admission: RE | Admit: 2014-11-09 | Discharge: 2014-11-09 | Disposition: A | Discharge status: Home / Self Care | Payer: Self-pay | Source: Ambulatory Visit | Attending: Nephrology | Admitting: Nephrology

## 2014-11-09 ENCOUNTER — Other Ambulatory Visit (HOSPITAL_COMMUNITY): Payer: Self-pay | Admitting: Nephrology

## 2014-11-09 DIAGNOSIS — N186 End stage renal disease: Secondary | ICD-10-CM | POA: Insufficient documentation

## 2014-11-09 DIAGNOSIS — Z4901 Encounter for fitting and adjustment of extracorporeal dialysis catheter: Secondary | ICD-10-CM | POA: Insufficient documentation

## 2014-11-09 MED ORDER — IOHEXOL 300 MG/ML  SOLN
100.0000 mL | Freq: Once | INTRAMUSCULAR | Status: DC | PRN
  Administered 2014-11-09: 50 mL via INTRAVENOUS
  Filled 2014-11-09: qty 100

## 2014-11-09 NOTE — Procedures (Signed)
LUE AVFistulagram No stenosis

## 2014-11-13 ENCOUNTER — Encounter: Payer: Self-pay | Admitting: Vascular Surgery

## 2014-11-13 ENCOUNTER — Ambulatory Visit (INDEPENDENT_AMBULATORY_CARE_PROVIDER_SITE_OTHER): Payer: Self-pay | Admitting: Vascular Surgery

## 2014-11-13 VITALS — BP 130/81 | HR 77 | Temp 98.0°F | Resp 16 | Ht 63.5 in | Wt 138.0 lb

## 2014-11-13 DIAGNOSIS — Z992 Dependence on renal dialysis: Secondary | ICD-10-CM

## 2014-11-13 DIAGNOSIS — N186 End stage renal disease: Secondary | ICD-10-CM

## 2014-11-13 NOTE — Progress Notes (Signed)
Filed Vitals:   11/13/14 0927 11/13/14 0931  BP: 143/85 130/81  Pulse: 75 77  Temp: 98 F (36.7 C)   TempSrc: Oral   Resp: 16   Height: 5' 3.5" (1.613 m)   Weight: 138 lb (62.596 kg)   SpO2: 100%

## 2014-11-13 NOTE — Progress Notes (Signed)
Patient presents today for evaluation of left arm AV access. He has a fair understanding of English. He is here with a Optometrist for [place]. Apparently he was having concern regarding aneurysms of his left arm AV fistula. He denies any difficulty with flow although I do not have any documentation of this. After some discussion with him and it became apparent that he had had a recent fistulogram at [facility] hospital and I have reviewed this as well. He did undergo duplex of his fistula in our office on 10/30/2014 and I reviewed this as well.  He is known to me from placement of a hemodialysis catheter and left radiocephalic fistula on [id number]. He has had excellent functioning of this fistula now for approximately 3 years.  Past Medical History  Diagnosis Date  . Hypertension   . Chronic kidney disease   . Renal failure     on dialysis  . Hypercholesteremia   . GERD (gastroesophageal reflux disease)     Social History  Substance Use Topics  . Smoking status: Never Smoker   . Smokeless tobacco: Never Used  . Alcohol Use: No    Family History  Problem Relation Age of Onset  . Hypertension Mother   . Lung cancer Father     No Known Allergies   Current outpatient prescriptions:  .  allopurinol (ZYLOPRIM) 100 MG tablet, Take 200 mg by mouth daily., Disp: , Rfl:  .  amLODipine (NORVASC) 10 MG tablet, Take 1 tablet (10 mg total) by mouth daily., Disp: 30 tablet, Rfl: 0 .  aspirin 81 MG tablet, Take 81 mg by mouth daily., Disp: , Rfl:  .  calcium elemental as carbonate (TUMS ULTRA 1000) 400 MG tablet, Chew 2,000 mg by mouth 3 (three) times daily with meals., Disp: , Rfl:  .  carvedilol (COREG) 6.25 MG tablet, Take 1 tablet (6.25 mg total) by mouth 2 (two) times daily., Disp: 180 tablet, Rfl: 3 .  multivitamin (RENA-VIT) TABS tablet, Take 1 tablet by mouth at bedtime., Disp: , Rfl:  .  simvastatin (ZOCOR) 20 MG tablet, Take 20 mg by mouth every evening., Disp: , Rfl:  .  Vitamin D,  Cholecalciferol, 1000 UNITS CAPS, Take 1 capsule by mouth 3 (three) times a week. MWF when he goes to dialysis, Disp: , Rfl:   Filed Vitals:   11/13/14 0927 11/13/14 0931  BP: 143/85 130/81  Pulse: 75 77  Temp: 98 F (36.7 C)   TempSrc: Oral   Resp: 16   Height: 5' 3.5" (1.613 m)   Weight: 138 lb (62.596 kg)   SpO2: 100%     Body mass index is 24.06 kg/(m^2).       On physical exam: He does have the marked dilatation of his cephalic vein throughout his forearm. He does not have any evidence of skin breakdown. There are 2 areas of approximately 1-1/2 cm diameter on the skin which be somewhat sensitive than the remainder of the skin. There is tortuosity of the fistula. His outflow was into his cephalic and basilic vein in his upper arm.  Duplex from 10 4 showed maximal diameter of 1.7 cm in his fistula. There are several areas of tortuosity and high velocity flow.  In reviewing his formal shuntogram from [facility] hospital, this does show excellent maturation of this fistula. He does have areas of tortuosity and area in the mid forearm with some narrowing related to the tortuosity  Impression and plan: I had long discussion with the patient the interpreter.  There is no history of bleeding and no evidence of skin breakdown so therefore not concerned regarding the aneurysmal change of his chronic fistula. Unfortunately I have no information from dialysis or nephrology regarding the function of this. On questioning the patient denies that he is having frequent alarming of the machine requiring line change. Does not appear that he is having any difficulty with flow with this fistula. I do not see any indication for surgical revision as long as he is having normal function of this fistula. He was reassured with this discussion. I asked him to notify us should he begin to have any skin breakdown. Otherwise we will see him again on as-needed basis

## 2015-03-12 ENCOUNTER — Encounter: Payer: Self-pay | Admitting: Vascular Surgery

## 2015-03-13 ENCOUNTER — Other Ambulatory Visit: Payer: Self-pay

## 2015-03-13 ENCOUNTER — Encounter: Payer: Self-pay | Admitting: Vascular Surgery

## 2015-03-13 DIAGNOSIS — T82510A Breakdown (mechanical) of surgically created arteriovenous fistula, initial encounter: Secondary | ICD-10-CM

## 2015-03-14 ENCOUNTER — Inpatient Hospital Stay (INDEPENDENT_AMBULATORY_CARE_PROVIDER_SITE_OTHER)
Admission: RE | Admit: 2015-03-14 | Discharge: 2015-03-14 | Disposition: A | Discharge status: Home / Self Care | Payer: Self-pay | Source: Ambulatory Visit

## 2015-03-14 ENCOUNTER — Ambulatory Visit: Payer: Self-pay | Admitting: Vascular Surgery

## 2015-03-14 DIAGNOSIS — T82510A Breakdown (mechanical) of surgically created arteriovenous fistula, initial encounter: Secondary | ICD-10-CM

## 2015-03-15 ENCOUNTER — Encounter: Payer: Self-pay | Admitting: Vascular Surgery

## 2015-03-18 ENCOUNTER — Ambulatory Visit (HOSPITAL_COMMUNITY)
Admission: RE | Admit: 2015-03-18 | Discharge: 2015-03-18 | Disposition: A | Discharge status: Home / Self Care | Payer: Self-pay | Source: Ambulatory Visit | Attending: Vascular Surgery | Admitting: Vascular Surgery

## 2015-03-18 DIAGNOSIS — T82510A Breakdown (mechanical) of surgically created arteriovenous fistula, initial encounter: Secondary | ICD-10-CM | POA: Insufficient documentation

## 2015-03-18 DIAGNOSIS — Y838 Other surgical procedures as the cause of abnormal reaction of the patient, or of later complication, without mention of misadventure at the time of the procedure: Secondary | ICD-10-CM | POA: Insufficient documentation

## 2015-03-22 ENCOUNTER — Other Ambulatory Visit: Payer: Self-pay

## 2015-03-22 ENCOUNTER — Ambulatory Visit (INDEPENDENT_AMBULATORY_CARE_PROVIDER_SITE_OTHER): Payer: Self-pay | Admitting: Vascular Surgery

## 2015-03-22 ENCOUNTER — Encounter: Payer: Self-pay | Admitting: Vascular Surgery

## 2015-03-22 VITALS — BP 160/90 | HR 85 | Ht 63.5 in | Wt 138.0 lb

## 2015-03-22 DIAGNOSIS — N186 End stage renal disease: Secondary | ICD-10-CM

## 2015-03-22 NOTE — Progress Notes (Signed)
Established Dialysis Access  History of Present Illness  Cole [name] is a 45 y.o. ([birth year]) male who presents for re-evaluation of left radiocephalic arteriovenous fistula .  This patient had this RC AVF placed in 10/05/11.  He has significant PSA in this fistula now with concern at HD for imminent rupture.  He denies any active bleeding yet.  Past Medical History  Diagnosis Date  . Hypertension   . Chronic kidney disease   . Renal failure     on dialysis  . Hypercholesteremia   . GERD (gastroesophageal reflux disease)     Past Surgical History  Procedure Laterality Date  . No past surgeries    . Insertion of dialysis catheter  10/05/2011    Procedure: INSERTION OF DIALYSIS CATHETER;  Surgeon: Rosetta Posner, MD;  Location: [place];  Service: Vascular;  Laterality: N/A;  right internal jugular vein  . Av fistula placement  10/05/2011    Procedure: ARTERIOVENOUS (AV) FISTULA CREATION;  Surgeon: Rosetta Posner, MD;  Location: [facility];  Service: Vascular;  Laterality: Left;  . Cardiac catheterization N/A 10/23/2014    Procedure: Right/Left Heart Cath and Coronary Angiography;  Surgeon: Belva Crome, MD;  Location: [place] [facility];  Service: Cardiovascular;  Laterality: N/A;    Social History   Social History  . Marital Status: Single    Spouse Name: N/A  . Number of Children: N/A  . Years of Education: N/A   Occupational History  . Not on file.   Social History Main Topics  . Smoking status: Never Smoker   . Smokeless tobacco: Never Used  . Alcohol Use: No  . Drug Use: No  . Sexual Activity: Yes    Birth Control/ Protection: None   Other Topics Concern  . Not on file   Social History Narrative    Family History  Problem Relation Age of Onset  . Hypertension Mother   . Lung cancer Father     Current Outpatient Prescriptions  Medication Sig Dispense Refill  . allopurinol (ZYLOPRIM) 100 MG tablet Take 200 mg by mouth daily.    Marland Kitchen amLODipine (NORVASC) 10 MG  tablet Take 1 tablet (10 mg total) by mouth daily. 30 tablet 0  . aspirin 81 MG tablet Take 81 mg by mouth daily.    . calcium elemental as carbonate (TUMS ULTRA 1000) 400 MG tablet Chew 2,000 mg by mouth 3 (three) times daily with meals.    . multivitamin (RENA-VIT) TABS tablet Take 1 tablet by mouth at bedtime.    . simvastatin (ZOCOR) 20 MG tablet Take 20 mg by mouth every evening.    . carvedilol (COREG) 6.25 MG tablet Take 1 tablet (6.25 mg total) by mouth 2 (two) times daily. (Patient not taking: Reported on 03/22/2015) 180 tablet 3  . Vitamin D, Cholecalciferol, 1000 UNITS CAPS Take 1 capsule by mouth 3 (three) times a week. Reported on 03/22/2015     No current facility-administered medications for this visit.     No Known Allergies   REVIEW OF SYSTEMS:  (Positives checked otherwise negative)  CARDIOVASCULAR:   [ ]  chest pain,  [ ]  chest pressure,  [ ]  palpitations,  [ ]  shortness of breath when laying flat,  [ ]  shortness of breath with exertion,   [ ]  pain in feet when walking,  [ ]  pain in feet when laying flat, [ ]  history of blood clot in veins (DVT),  [ ]  history of phlebitis,  [ ]   swelling in legs,  [ ]  varicose veins  PULMONARY:   [ ]  productive cough,  [ ]  asthma,  [ ]  wheezing  NEUROLOGIC:   [ ]  weakness in arms or legs,  [ ]  numbness in arms or legs,  [ ]  difficulty speaking or slurred speech,  [ ]  temporary loss of vision in one eye,  [ ]  dizziness  HEMATOLOGIC:   [ ]  bleeding problems,  [ ]  problems with blood clotting too easily  MUSCULOSKEL:   [ ]  joint pain, [ ]  joint swelling  GASTROINTEST:   [ ]  vomiting blood,  [ ]  blood in stool     GENITOURINARY:   [ ]  burning with urination,  [ ]  blood in urine [x]  ESRD-HD: M-W-F  PSYCHIATRIC:   [ ]  history of major depression  INTEGUMENTARY:   [ ]  rashes,  [ ]  ulcers  CONSTITUTIONAL:   [ ]  fever,  [ ]  chills      Physical Examination  Filed Vitals:   03/22/15 0859 03/22/15 0901    BP: 160/92 160/90  Pulse: 85   Height: 5' 3.5" (1.613 m)   Weight: 138 lb (62.596 kg)   SpO2: 100%    Body mass index is 24.06 kg/(m^2).  General: A&O x 3, WD, WN  Pulmonary: Sym exp, good air movt, CTAB, no rales, rhonchi, & wheezing  Cardiac: RRR, Nl S1, S2, no Murmurs, rubs or gallops  Vascular: Vessel Right Left  Radial Palpable Palpable  Ulnar Not Palpable Not Palpable  Brachial Palpable Palpable   Gastrointestinal: soft, NTND, no G/R, bo HSM, no masses, no CVAT B  Musculoskeletal: M/S 5/5 throughout , Extremities without  ischemic changes , + palpable thrill in access in L RC AVF, + bruit in access: two large pseudoaneurysm in L RC AVF with areas of attenuated skin  Neurologic: Pain and light touch intact in extremities , Motor exam as listed above   Medical Decision Making  Cole [name] is a 45 y.o. male who presents with ESRD requiring hemodialysis, pseudoaneurysms L RC AVF at risk for rupture   I recommend: plication of L RC AVF PSA x 2.  I think I do the plication without having to place a TDC. Risk, benefits, and alternatives to access surgery were discussed.   The patient is aware the risks include but are not limited to: bleeding, infection, steal syndrome, nerve damage, ischemic monomelic neuropathy, failure to mature, need for additional procedures, death and stroke.    The patient has agreed to proceed with the above procedure which will be scheduled 7 MAR 17.   Adele Barthel, MD [facility] [place] Office: [phone] Pager: [phone]  03/22/2015, 9:17 AM

## 2015-04-01 ENCOUNTER — Encounter (HOSPITAL_COMMUNITY): Payer: Self-pay | Admitting: *Deleted

## 2015-04-01 NOTE — Progress Notes (Signed)
Spoke with pt via [place] (ID# [id number]) for pre-op call. Pt denies cardiac history, had chest pain back in October and went to the ED and they "couldn't find anything". Has not had any since. Pt will bring his medications with him in the AM. He states he takes all his medications at nighttime.  EKG -  11/07/14 Cath - 10/23/14 Stress - 10/02/14

## 2015-04-02 ENCOUNTER — Telehealth: Payer: Self-pay | Admitting: Vascular Surgery

## 2015-04-02 ENCOUNTER — Encounter: Payer: Self-pay | Admitting: *Deleted

## 2015-04-02 ENCOUNTER — Ambulatory Visit (HOSPITAL_COMMUNITY): Payer: MEDICAID | Admitting: Certified Registered Nurse Anesthetist

## 2015-04-02 ENCOUNTER — Encounter (HOSPITAL_COMMUNITY)
Admission: RE | Disposition: A | Discharge status: Home / Self Care | Payer: Self-pay | Source: Ambulatory Visit | Attending: Vascular Surgery

## 2015-04-02 ENCOUNTER — Telehealth: Payer: Self-pay | Admitting: *Deleted

## 2015-04-02 ENCOUNTER — Encounter (HOSPITAL_COMMUNITY): Payer: Self-pay | Admitting: Certified Registered Nurse Anesthetist

## 2015-04-02 ENCOUNTER — Ambulatory Visit (HOSPITAL_COMMUNITY)
Admission: RE | Admit: 2015-04-02 | Discharge: 2015-04-02 | Disposition: A | Discharge status: Home / Self Care | Payer: Self-pay | Source: Ambulatory Visit | Attending: Vascular Surgery | Admitting: Vascular Surgery

## 2015-04-02 DIAGNOSIS — T82898A Other specified complication of vascular prosthetic devices, implants and grafts, initial encounter: Secondary | ICD-10-CM

## 2015-04-02 DIAGNOSIS — E7800 Pure hypercholesterolemia, unspecified: Secondary | ICD-10-CM | POA: Insufficient documentation

## 2015-04-02 DIAGNOSIS — I120 Hypertensive chronic kidney disease with stage 5 chronic kidney disease or end stage renal disease: Secondary | ICD-10-CM | POA: Insufficient documentation

## 2015-04-02 DIAGNOSIS — K219 Gastro-esophageal reflux disease without esophagitis: Secondary | ICD-10-CM | POA: Insufficient documentation

## 2015-04-02 DIAGNOSIS — Z7982 Long term (current) use of aspirin: Secondary | ICD-10-CM | POA: Insufficient documentation

## 2015-04-02 DIAGNOSIS — Y832 Surgical operation with anastomosis, bypass or graft as the cause of abnormal reaction of the patient, or of later complication, without mention of misadventure at the time of the procedure: Secondary | ICD-10-CM | POA: Insufficient documentation

## 2015-04-02 DIAGNOSIS — Z992 Dependence on renal dialysis: Secondary | ICD-10-CM | POA: Insufficient documentation

## 2015-04-02 DIAGNOSIS — N186 End stage renal disease: Secondary | ICD-10-CM | POA: Insufficient documentation

## 2015-04-02 HISTORY — PX: REVISON OF ARTERIOVENOUS FISTULA: SHX6074

## 2015-04-02 HISTORY — DX: Constipation, unspecified: K59.00

## 2015-04-02 LAB — POCT I-STAT 4, (NA,K, GLUC, HGB,HCT)
Glucose, Bld: 98 mg/dL (ref 65–99)
HCT: 40 % (ref 39.0–52.0)
Hemoglobin: 13.6 g/dL (ref 13.0–17.0)
Potassium: 5.3 mmol/L — ABNORMAL HIGH (ref 3.5–5.1)
Sodium: 136 mmol/L (ref 135–145)

## 2015-04-02 SURGERY — REVISON OF ARTERIOVENOUS FISTULA
Anesthesia: Monitor Anesthesia Care | Site: Arm Lower | Laterality: Left

## 2015-04-02 MED ORDER — DEXTROSE 5 % IV SOLN
INTRAVENOUS | Status: AC
  Filled 2015-04-02: qty 1.5

## 2015-04-02 MED ORDER — CHLORHEXIDINE GLUCONATE CLOTH 2 % EX PADS: 6.0000 | MEDICATED_PAD | Freq: Once | CUTANEOUS | Status: DC

## 2015-04-02 MED ORDER — PROMETHAZINE HCL 25 MG/ML IJ SOLN: 6.2500 mg | INTRAMUSCULAR | Status: DC | PRN

## 2015-04-02 MED ORDER — SUCCINYLCHOLINE CHLORIDE 20 MG/ML IJ SOLN
INTRAMUSCULAR | Status: AC
  Filled 2015-04-02: qty 1

## 2015-04-02 MED ORDER — HYDROMORPHONE HCL 1 MG/ML IJ SOLN: 0.2500 mg | INTRAMUSCULAR | Status: DC | PRN

## 2015-04-02 MED ORDER — LIDOCAINE HCL (CARDIAC) 20 MG/ML IV SOLN
INTRAVENOUS | Status: AC
  Filled 2015-04-02: qty 10

## 2015-04-02 MED ORDER — HEPARIN SODIUM (PORCINE) 1000 UNIT/ML IJ SOLN
INTRAMUSCULAR | Status: AC
  Filled 2015-04-02: qty 1

## 2015-04-02 MED ORDER — DEXAMETHASONE SODIUM PHOSPHATE 4 MG/ML IJ SOLN
INTRAMUSCULAR | Status: DC | PRN
  Administered 2015-04-02: 4 mg via INTRAVENOUS

## 2015-04-02 MED ORDER — HEMOSTATIC AGENTS (NO CHARGE) OPTIME
TOPICAL | Status: DC | PRN
  Administered 2015-04-02: 1 via TOPICAL

## 2015-04-02 MED ORDER — 0.9 % SODIUM CHLORIDE (POUR BTL) OPTIME
TOPICAL | Status: DC | PRN
  Administered 2015-04-02: 1000 mL

## 2015-04-02 MED ORDER — PROPOFOL 10 MG/ML IV BOLUS
INTRAVENOUS | Status: DC | PRN
  Administered 2015-04-02: 20 mg via INTRAVENOUS

## 2015-04-02 MED ORDER — MIDAZOLAM HCL 2 MG/2ML IJ SOLN
INTRAMUSCULAR | Status: AC
  Filled 2015-04-02: qty 2

## 2015-04-02 MED ORDER — LIDOCAINE HCL (CARDIAC) 20 MG/ML IV SOLN
INTRAVENOUS | Status: DC | PRN
  Administered 2015-04-02: 80 mg via INTRAVENOUS

## 2015-04-02 MED ORDER — DEXTROSE 5 % IV SOLN
1.5000 g | INTRAVENOUS | Status: AC
  Administered 2015-04-02: 1.5 g via INTRAVENOUS

## 2015-04-02 MED ORDER — PHENYLEPHRINE 40 MCG/ML (10ML) SYRINGE FOR IV PUSH (FOR BLOOD PRESSURE SUPPORT)
PREFILLED_SYRINGE | INTRAVENOUS | Status: AC
  Filled 2015-04-02: qty 10

## 2015-04-02 MED ORDER — SODIUM POLYSTYRENE SULFONATE 15 GM/60ML PO SUSP
15.0000 g | Freq: Once | ORAL | Status: DC
  Filled 2015-04-02 (×2): qty 60

## 2015-04-02 MED ORDER — FENTANYL CITRATE (PF) 250 MCG/5ML IJ SOLN
INTRAMUSCULAR | Status: DC | PRN
  Administered 2015-04-02 (×2): 25 ug via INTRAVENOUS
  Administered 2015-04-02: 50 ug via INTRAVENOUS

## 2015-04-02 MED ORDER — PROTAMINE SULFATE 10 MG/ML IV SOLN
INTRAVENOUS | Status: AC
  Filled 2015-04-02: qty 5

## 2015-04-02 MED ORDER — LIDOCAINE HCL (CARDIAC) 20 MG/ML IV SOLN
INTRAVENOUS | Status: AC
  Filled 2015-04-02: qty 5

## 2015-04-02 MED ORDER — PHENYLEPHRINE HCL 10 MG/ML IJ SOLN
INTRAMUSCULAR | Status: DC | PRN
  Administered 2015-04-02 (×2): 40 ug via INTRAVENOUS

## 2015-04-02 MED ORDER — LIDOCAINE HCL (PF) 1 % IJ SOLN
INTRAMUSCULAR | Status: DC | PRN
  Administered 2015-04-02: 10 mL

## 2015-04-02 MED ORDER — MIDAZOLAM HCL 2 MG/2ML IJ SOLN
INTRAMUSCULAR | Status: DC | PRN
  Administered 2015-04-02: 2 mg via INTRAVENOUS

## 2015-04-02 MED ORDER — SODIUM CHLORIDE 0.9 % IV SOLN
INTRAVENOUS | Status: DC
  Administered 2015-04-02: 07:00:00 via INTRAVENOUS

## 2015-04-02 MED ORDER — PROPOFOL 500 MG/50ML IV EMUL
INTRAVENOUS | Status: DC | PRN
  Administered 2015-04-02: 50 ug/kg/min via INTRAVENOUS

## 2015-04-02 MED ORDER — EPHEDRINE SULFATE 50 MG/ML IJ SOLN
INTRAMUSCULAR | Status: AC
  Filled 2015-04-02: qty 1

## 2015-04-02 MED ORDER — PROPOFOL 10 MG/ML IV BOLUS
INTRAVENOUS | Status: AC
  Filled 2015-04-02: qty 20

## 2015-04-02 MED ORDER — HEPARIN SODIUM (PORCINE) 1000 UNIT/ML IJ SOLN
INTRAMUSCULAR | Status: DC | PRN
  Administered 2015-04-02: 6.5 mL via INTRAVENOUS

## 2015-04-02 MED ORDER — PROTAMINE SULFATE 10 MG/ML IV SOLN
INTRAVENOUS | Status: DC | PRN
  Administered 2015-04-02: 30 mg via INTRAVENOUS

## 2015-04-02 MED ORDER — OXYCODONE HCL 5 MG PO TABS: 5.0000 mg | ORAL_TABLET | Freq: Four times a day (QID) | ORAL | Status: DC | PRN

## 2015-04-02 MED ORDER — ROCURONIUM BROMIDE 50 MG/5ML IV SOLN
INTRAVENOUS | Status: AC
  Filled 2015-04-02: qty 1

## 2015-04-02 MED ORDER — STERILE WATER FOR INJECTION IJ SOLN
INTRAMUSCULAR | Status: AC
  Filled 2015-04-02: qty 10

## 2015-04-02 MED ORDER — LIDOCAINE HCL (PF) 1 % IJ SOLN
INTRAMUSCULAR | Status: AC
  Filled 2015-04-02: qty 30

## 2015-04-02 MED ORDER — SODIUM CHLORIDE 0.9 % IV SOLN
INTRAVENOUS | Status: DC | PRN
  Administered 2015-04-02: 08:00:00

## 2015-04-02 MED ORDER — ONDANSETRON HCL 4 MG/2ML IJ SOLN
INTRAMUSCULAR | Status: DC | PRN
  Administered 2015-04-02: 4 mg via INTRAVENOUS

## 2015-04-02 MED ORDER — FENTANYL CITRATE (PF) 250 MCG/5ML IJ SOLN
INTRAMUSCULAR | Status: AC
  Filled 2015-04-02: qty 5

## 2015-04-02 SURGICAL SUPPLY — 40 items
AGENT HMST SPONGE THK3/8 (HEMOSTASIS) ×1
BANDAGE ACE 4X5 VEL STRL LF (GAUZE/BANDAGES/DRESSINGS) ×1 IMPLANT
BNDG CMPR 9X4 STRL LF SNTH (GAUZE/BANDAGES/DRESSINGS) ×1
BNDG ESMARK 4X9 LF (GAUZE/BANDAGES/DRESSINGS) ×1 IMPLANT
CANISTER SUCTION 2500CC (MISCELLANEOUS) ×2 IMPLANT
CLIP TI MEDIUM 6 (CLIP) ×2 IMPLANT
CLIP TI WIDE RED SMALL 6 (CLIP) ×2 IMPLANT
COVER PROBE W GEL 5X96 (DRAPES) IMPLANT
CUFF TOURNIQUET SINGLE 18IN (TOURNIQUET CUFF) ×1 IMPLANT
DECANTER SPIKE VIAL GLASS SM (MISCELLANEOUS) ×1 IMPLANT
DRAIN PENROSE 1/2X12 LTX STRL (WOUND CARE) IMPLANT
DRSG EMULSION OIL 3X3 NADH (GAUZE/BANDAGES/DRESSINGS) ×1 IMPLANT
ELECT REM PT RETURN 9FT ADLT (ELECTROSURGICAL) ×2
ELECTRODE REM PT RTRN 9FT ADLT (ELECTROSURGICAL) ×1 IMPLANT
GLOVE BIO SURGEON STRL SZ 6.5 (GLOVE) ×6 IMPLANT
GLOVE BIO SURGEON STRL SZ7 (GLOVE) ×2 IMPLANT
GLOVE BIOGEL PI IND STRL 6.5 (GLOVE) IMPLANT
GLOVE BIOGEL PI IND STRL 7.5 (GLOVE) ×1 IMPLANT
GLOVE BIOGEL PI INDICATOR 6.5 (GLOVE)
GLOVE BIOGEL PI INDICATOR 7.5 (GLOVE) ×1
GOWN STRL REUS W/ TWL LRG LVL3 (GOWN DISPOSABLE) ×3 IMPLANT
GOWN STRL REUS W/TWL LRG LVL3 (GOWN DISPOSABLE) ×6
HEMOSTAT SPONGE AVITENE ULTRA (HEMOSTASIS) ×2 IMPLANT
KIT BASIN OR (CUSTOM PROCEDURE TRAY) ×2 IMPLANT
KIT ROOM TURNOVER OR (KITS) ×2 IMPLANT
LIQUID BAND (GAUZE/BANDAGES/DRESSINGS) ×2 IMPLANT
NS IRRIG 1000ML POUR BTL (IV SOLUTION) ×2 IMPLANT
PACK CV ACCESS (CUSTOM PROCEDURE TRAY) ×2 IMPLANT
PAD ARMBOARD 7.5X6 YLW CONV (MISCELLANEOUS) ×4 IMPLANT
SPONGE GAUZE 4X4 12PLY STER LF (GAUZE/BANDAGES/DRESSINGS) ×2 IMPLANT
SPONGE SURGIFOAM ABS GEL 100 (HEMOSTASIS) IMPLANT
STAPLER VISISTAT 35W (STAPLE) ×1 IMPLANT
SUT MNCRL AB 4-0 PS2 18 (SUTURE) ×2 IMPLANT
SUT PROLENE 5 0 C 1 24 (SUTURE) ×3 IMPLANT
SUT PROLENE 6 0 BV (SUTURE) IMPLANT
SUT PROLENE 7 0 BV 1 (SUTURE) ×2 IMPLANT
SUT VIC AB 3-0 SH 27 (SUTURE) ×2
SUT VIC AB 3-0 SH 27X BRD (SUTURE) ×1 IMPLANT
UNDERPAD 30X30 INCONTINENT (UNDERPADS AND DIAPERS) ×2 IMPLANT
WATER STERILE IRR 1000ML POUR (IV SOLUTION) IMPLANT

## 2015-04-02 NOTE — Progress Notes (Signed)
Interpreter Lesle Chris for PACU

## 2015-04-02 NOTE — Transfer of Care (Signed)
Immediate Anesthesia Transfer of Care Note  Patient: Cole [name]  Procedure(s) Performed: Procedure(s): PLICATION OF LEFT RADIOCEPHALIC ARTERIOVENOUS FISTULA -PSEUDOANEURYSM TIMES TWO (Left)  Patient Location: PACU  Anesthesia Type:MAC  Level of Consciousness: awake, alert  and oriented  Airway & Oxygen Therapy: Patient Spontanous Breathing and Patient connected to face mask oxygen  Post-op Assessment: Report given to RN and Post -op Vital signs reviewed and stable  Post vital signs: Reviewed and stable  Last Vitals:  Filed Vitals:   04/02/15 0605  BP: 160/86  Pulse: 70  Temp: 36.6 C  Resp: 8    Complications: No apparent anesthesia complications

## 2015-04-02 NOTE — Telephone Encounter (Signed)
-----   Message from Mena Goes, RN sent at 04/02/2015  9:20 AM EST ----- Regarding: schedule   ----- Message -----    From: Gabriel Earing, PA-C    Sent: 04/02/2015   9:17 AM      To: Vvs Charge Pool  Needs to f/u in 2 weeks for staple removal and also see Dr. Bridgett Larsson in 4 weeks for evaluation of fistula after plication today [id number].  Thanks, Aldona Bar

## 2015-04-02 NOTE — H&P (View-Only) (Signed)
Established Dialysis Access  History of Present Illness  Cole [name] is a 45 y.o. (1970-06-21) male who presents for re-evaluation of left radiocephalic arteriovenous fistula .  This patient had this RC AVF placed in 10/05/11.  He has significant PSA in this fistula now with concern at HD for imminent rupture.  He denies any active bleeding yet.  Past Medical History  Diagnosis Date  . Hypertension   . Chronic kidney disease   . Renal failure     on dialysis  . Hypercholesteremia   . GERD (gastroesophageal reflux disease)     Past Surgical History  Procedure Laterality Date  . No past surgeries    . Insertion of dialysis catheter  10/05/2011    Procedure: INSERTION OF DIALYSIS CATHETER;  Surgeon: Rosetta Posner, MD;  Location: [place];  Service: Vascular;  Laterality: N/A;  right internal jugular vein  . Av fistula placement  10/05/2011    Procedure: ARTERIOVENOUS (AV) FISTULA CREATION;  Surgeon: Rosetta Posner, MD;  Location: [facility] OR;  Service: Vascular;  Laterality: Left;  . Cardiac catheterization N/A 10/23/2014    Procedure: Right/Left Heart Cath and Coronary Angiography;  Surgeon: Belva Crome, MD;  Location: [place] [facility];  Service: Cardiovascular;  Laterality: N/A;    Social History   Social History  . Marital Status: Single    Spouse Name: N/A  . Number of Children: N/A  . Years of Education: N/A   Occupational History  . Not on file.   Social History Main Topics  . Smoking status: Never Smoker   . Smokeless tobacco: Never Used  . Alcohol Use: No  . Drug Use: No  . Sexual Activity: Yes    Birth Control/ Protection: None   Other Topics Concern  . Not on file   Social History Narrative    Family History  Problem Relation Age of Onset  . Hypertension Mother   . Lung cancer Father     Current Outpatient Prescriptions  Medication Sig Dispense Refill  . allopurinol (ZYLOPRIM) 100 MG tablet Take 200 mg by mouth daily.    Marland Kitchen amLODipine (NORVASC) 10 MG  tablet Take 1 tablet (10 mg total) by mouth daily. 30 tablet 0  . aspirin 81 MG tablet Take 81 mg by mouth daily.    . calcium elemental as carbonate (TUMS ULTRA 1000) 400 MG tablet Chew 2,000 mg by mouth 3 (three) times daily with meals.    . multivitamin (RENA-VIT) TABS tablet Take 1 tablet by mouth at bedtime.    . simvastatin (ZOCOR) 20 MG tablet Take 20 mg by mouth every evening.    . carvedilol (COREG) 6.25 MG tablet Take 1 tablet (6.25 mg total) by mouth 2 (two) times daily. (Patient not taking: Reported on 03/22/2015) 180 tablet 3  . Vitamin D, Cholecalciferol, 1000 UNITS CAPS Take 1 capsule by mouth 3 (three) times a week. Reported on 03/22/2015     No current facility-administered medications for this visit.     No Known Allergies   REVIEW OF SYSTEMS:  (Positives checked otherwise negative)  CARDIOVASCULAR:   [ ]  chest pain,  [ ]  chest pressure,  [ ]  palpitations,  [ ]  shortness of breath when laying flat,  [ ]  shortness of breath with exertion,   [ ]  pain in feet when walking,  [ ]  pain in feet when laying flat, [ ]  history of blood clot in veins (DVT),  [ ]  history of phlebitis,  [ ]   swelling in legs,  [ ]  varicose veins  PULMONARY:   [ ]  productive cough,  [ ]  asthma,  [ ]  wheezing  NEUROLOGIC:   [ ]  weakness in arms or legs,  [ ]  numbness in arms or legs,  [ ]  difficulty speaking or slurred speech,  [ ]  temporary loss of vision in one eye,  [ ]  dizziness  HEMATOLOGIC:   [ ]  bleeding problems,  [ ]  problems with blood clotting too easily  MUSCULOSKEL:   [ ]  joint pain, [ ]  joint swelling  GASTROINTEST:   [ ]  vomiting blood,  [ ]  blood in stool     GENITOURINARY:   [ ]  burning with urination,  [ ]  blood in urine [x]  ESRD-HD: M-W-F  PSYCHIATRIC:   [ ]  history of major depression  INTEGUMENTARY:   [ ]  rashes,  [ ]  ulcers  CONSTITUTIONAL:   [ ]  fever,  [ ]  chills      Physical Examination  Filed Vitals:   03/22/15 0859 03/22/15 0901    BP: 160/92 160/90  Pulse: 85   Height: 5' 3.5" (1.613 m)   Weight: 138 lb (62.596 kg)   SpO2: 100%    Body mass index is 24.06 kg/(m^2).  General: A&O x 3, WD, WN  Pulmonary: Sym exp, good air movt, CTAB, no rales, rhonchi, & wheezing  Cardiac: RRR, Nl S1, S2, no Murmurs, rubs or gallops  Vascular: Vessel Right Left  Radial Palpable Palpable  Ulnar Not Palpable Not Palpable  Brachial Palpable Palpable   Gastrointestinal: soft, NTND, no G/R, bo HSM, no masses, no CVAT B  Musculoskeletal: M/S 5/5 throughout , Extremities without  ischemic changes , + palpable thrill in access in L RC AVF, + bruit in access: two large pseudoaneurysm in L RC AVF with areas of attenuated skin  Neurologic: Pain and light touch intact in extremities , Motor exam as listed above   Medical Decision Making  Cole [name] is a 45 y.o. male who presents with ESRD requiring hemodialysis, pseudoaneurysms L RC AVF at risk for rupture   I recommend: plication of L RC AVF PSA x 2.  I think I do the plication without having to place a TDC. Risk, benefits, and alternatives to access surgery were discussed.   The patient is aware the risks include but are not limited to: bleeding, infection, steal syndrome, nerve damage, ischemic monomelic neuropathy, failure to mature, need for additional procedures, death and stroke.    The patient has agreed to proceed with the above procedure which will be scheduled 7 MAR 17.   Adele Barthel, MD [facility] Office: [phone] Pager: [phone]  03/22/2015, 9:17 AM

## 2015-04-02 NOTE — Interval H&P Note (Signed)
History and Physical Interval Note:  04/02/2015 7:23 AM  Cole [name]  has presented today for surgery, with the diagnosis of Pseudoaneurysm left arm AVF I72.9 End Stage Renal Disease N18.6  The various methods of treatment have been discussed with the patient and family. After consideration of risks, benefits and other options for treatment, the patient has consented to  Procedure(s): PLICATION OF LEFT RADIOCEPHALIC ARTERIOVENOUS FISTULA- PSEUDOANEURYSM X 2 (Left) as a surgical intervention .  The patient's history has been reviewed, patient examined, no change in status, stable for surgery.  I have reviewed the patient's chart and labs.  Questions were answered to the patient's satisfaction.     Adele Barthel

## 2015-04-02 NOTE — Telephone Encounter (Signed)
Spoke with pt and his friend to schedule appts, dpm

## 2015-04-02 NOTE — Progress Notes (Signed)
Interpreter Lesle Chris for pre-surgery at short stay room 43

## 2015-04-02 NOTE — Anesthesia Postprocedure Evaluation (Signed)
Anesthesia Post Note  Patient: Cole [name]  Procedure(s) Performed: Procedure(s) (LRB): PLICATION OF LEFT RADIOCEPHALIC ARTERIOVENOUS FISTULA -PSEUDOANEURYSM TIMES TWO (Left)  Patient location during evaluation: PACU Anesthesia Type: MAC Level of consciousness: awake Pain management: pain level controlled Vital Signs Assessment: post-procedure vital signs reviewed and stable Respiratory status: spontaneous breathing Cardiovascular status: stable Anesthetic complications: no    Last Vitals:  Filed Vitals:   04/02/15 1000 04/02/15 1011  BP:  144/94  Pulse: 64 86  Temp: 36.4 C   Resp: 13     Last Pain: There were no vitals filed for this visit.               EDWARDS,Javeon Macmurray

## 2015-04-02 NOTE — Discharge Instructions (Signed)
° ° °  04/02/2015 Uno Schaffter [id number] 01/08/1971  Surgeon(s): Conrad Woodland, MD  Procedure(s): PLICATION OF LEFT RADIOCEPHALIC ARTERIOVENOUS FISTULA -PSEUDOANEURYSM TIMES TWO  x May stick graft on designated area only:  Do NOT stick over incisions x 4 weeks. May stick above and below incisions immediately. SEE DIAGRAM!!-

## 2015-04-02 NOTE — Op Note (Signed)
    OPERATIVE NOTE   PROCEDURE: 1. Plication of left radiocephalic arteriovenous fistula x 2  PRE-OPERATIVE DIAGNOSIS: Pseudoaneurysm degeneration of arteriovenous fistula   POST-OPERATIVE DIAGNOSIS: same as above   SURGEON: Adele Barthel, MD  ASSISTANT(S): Leontine Locket, PAC   ANESTHESIA: local and MAC  ESTIMATED BLOOD LOSS: 50 cc  FINDING(S): 1. Two moderate sized pseudoaneurysm 2. Residual lumen >5 mm 3. Palpable thrill at end of the case  SPECIMEN(S):  none  INDICATIONS:   Cole [name] is a 45 y.o. 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  In order to salvage the fistula and decrease the bleeding complication risks, I recommended plication of the fistula.  Risk, benefits, and alternatives to access surgery were discussed.  The patient is aware the risks include but are not limited to: bleeding, infection, steal syndrome, nerve damage, ischemic monomelic neuropathy, failure to mature, need for additional procedures, death and stroke.  The patient agrees to proceed forward with the procedure.  DESCRIPTION: After obtaining full informed written consent, the patient was brought back to the operating room and placed supine upon the operating table.  The patient received IV antibiotics prior to induction.  After obtaining adequate anesthesia, the patient was prepped and draped in the standard fashion for: left access procedure.  The patient was given 6500 units of Heparin intravenously, which was a therapeutic bolus.  After waiting 5 minutes, a sterile tourniquet was applied to the upper arm and inflated to 250 mm Hg after exsanguinating the arm with an Esmark bandage.  An elliptical incision was made around the skin overlying each of the two pseudoaneurysmal segments in the fistula.  I dissected the skin away from the pseudoaneurysm with electrocautery.  Eventually, I was able to skeletonize each pseudoaneurysm.  I sharply  entered into the each pseudoaneurysm to gain visualization of the geometry of each pseudoaneurysm.  I sharply tailored each pseudoaneurysm, removing redundant pseudoaneurysm wall.  The residual fistula wall in each plication site was felt to be adequately strong to repair.  I repaired each fistula with a running stitch of 5-0 Prolene, taking care to take large bites of the fistula wall.  Prior to completing this repair, I pass 5 mm dilator proximally and distally: no resistance was encountered.  The repair was completed in the usual fashion.  At this point, the patient was given 30 mg of Protamine intravenously to reverse the anticoagulation.  Avitene were applied to the incision.  Bleeding points were controlled with suture ligature and electrocautery.  After a few more round of Avitene and electrocautery, no further active bleeding was noted.  I reapproximated the subcutaneous tissue immediately above the fistula with a running stitch of 3-0 Vicryl.  The skin was reapproximated with staples.  The skin was cleaned, dried, and the skin reinforced with Dermabond.  The patient continued to have a palpable thrill in the fistula.   COMPLICATIONS: none  CONDITION: stable  Adele Barthel, MD [facility] [place] Office: [phone] Pager: [phone]  04/02/2015, 8:58 AM

## 2015-04-02 NOTE — Anesthesia Preprocedure Evaluation (Addendum)
Anesthesia Evaluation  Patient identified by MRN, date of birth, ID band Patient awake    Reviewed: Allergy & Precautions, NPO status , Patient's Chart, lab work & pertinent test results, reviewed documented beta blocker date and time   Airway Mallampati: II  TM Distance: >3 FB Neck ROM: Full    Dental  (+) Teeth Intact, Dental Advisory Given   Pulmonary    breath sounds clear to auscultation       Cardiovascular hypertension, Pt. on medications and Pt. on home beta blockers +CHF   Rhythm:Regular Rate:Normal     Neuro/Psych    GI/Hepatic GERD  ,  Endo/Other    Renal/GU ESRF and DialysisRenal disease     Musculoskeletal   Abdominal   Peds  Hematology   Anesthesia Other Findings   Reproductive/Obstetrics                            Anesthesia Physical Anesthesia Plan  ASA: III  Anesthesia Plan: MAC   Post-op Pain Management:    Induction: Intravenous  Airway Management Planned: Simple Face Mask  Additional Equipment:   Intra-op Plan:   Post-operative Plan:   Informed Consent: I have reviewed the patients History and Physical, chart, labs and discussed the procedure including the risks, benefits and alternatives for the proposed anesthesia with the patient or authorized representative who has indicated his/her understanding and acceptance.   Dental advisory given  Plan Discussed with: Anesthesiologist and CRNA  Anesthesia Plan Comments:         Anesthesia Quick Evaluation

## 2015-04-02 NOTE — Telephone Encounter (Signed)
Patient needed out of work note. He had plication X 2 this morning by Dr. Bridgett Larsson for AVF pseudoaneurysms. I Faxed a note keeping him out until he comes back for staple removal on 04-19-15. Faxed note to Ezekiel at [phone]. 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

## 2015-04-03 ENCOUNTER — Encounter (HOSPITAL_COMMUNITY): Payer: Self-pay | Admitting: Vascular Surgery

## 2015-04-03 NOTE — Anesthesia Postprocedure Evaluation (Signed)
Anesthesia Post Note  Patient: Cole [name]  Procedure(s) Performed: Procedure(s) (LRB): PLICATION OF LEFT RADIOCEPHALIC ARTERIOVENOUS FISTULA -PSEUDOANEURYSM TIMES TWO (Left)  Patient location during evaluation: PACU Anesthesia Type: General Level of consciousness: awake Pain management: pain level controlled Vital Signs Assessment: post-procedure vital signs reviewed and stable Respiratory status: spontaneous breathing Cardiovascular status: stable Anesthetic complications: no    Last Vitals:  Filed Vitals:   04/02/15 1000 04/02/15 1011  BP:  144/94  Pulse: 64 86  Temp: 36.4 C   Resp: 13     Last Pain: There were no vitals filed for this visit.               EDWARDS,Jennetta Flood

## 2015-04-15 ENCOUNTER — Encounter: Payer: Self-pay | Admitting: Vascular Surgery

## 2015-04-18 NOTE — Progress Notes (Signed)
    Postoperative Access Visit   History of Present Illness  Alfonza Zimmerer is a 45 y.o. year old male who presents for postoperative follow-up for: plication of L RC AVF x 2 (Date: 04/02/15).  The patient's wounds are healed.  The patient notes no steal symptoms.  The patient is able to complete their activities of daily living.  The patient's current symptoms are: none.  For VQI Use Only  PRE-ADM LIVING: Home  AMB STATUS: Ambulatory  Physical Examination Filed Vitals:   04/19/15 0852 04/19/15 0854  BP: 169/97 166/96  Pulse: 77 76  Temp: 97.6 F (36.4 C)   Resp: 16     LUE: Incisions are  Healed with staples in place, skin feels warm, hand grip is 5/5, sensation in digits is intact, palpable thrill, bruit can be auscultated, distal fistula is aneurysmal  Medical Decision Making  Emmaus Mcaree is a 45 y.o. year old male who presents s/p  plication of L RC AVF PSA x 2 .  The patient's access will be ready for complete use in 2 weeks.  I would continue to cannulate proximal and distal to the recent surgical areas.  Thank you for allowing [place] to participate in this patient's care.  Adele Barthel, MD [facility] [place] Office: [phone] Pager: [phone]  04/19/2015, 9:11 AM

## 2015-04-19 ENCOUNTER — Encounter: Payer: Self-pay | Admitting: Vascular Surgery

## 2015-04-19 ENCOUNTER — Ambulatory Visit (INDEPENDENT_AMBULATORY_CARE_PROVIDER_SITE_OTHER): Payer: Self-pay | Admitting: Vascular Surgery

## 2015-04-19 VITALS — BP 166/96 | HR 76 | Temp 97.6°F | Resp 16 | Ht 61.0 in | Wt 137.0 lb

## 2015-04-19 DIAGNOSIS — N186 End stage renal disease: Secondary | ICD-10-CM

## 2015-04-19 DIAGNOSIS — Z992 Dependence on renal dialysis: Secondary | ICD-10-CM

## 2015-04-19 NOTE — Progress Notes (Signed)
Filed Vitals:   04/19/15 0852 04/19/15 0854  BP: 169/97 166/96  Pulse: 77 76  Temp: 97.6 F (36.4 C)   Resp: 16   Height: 5\' 1"  (1.549 m)   Weight: 137 lb (62.143 kg)   SpO2: 100%

## 2015-04-26 ENCOUNTER — Encounter: Payer: Self-pay | Admitting: Family

## 2015-05-03 ENCOUNTER — Ambulatory Visit: Payer: Self-pay | Admitting: Family

## 2016-11-20 IMAGING — CR DG CHEST 2V
2 series · 2 of 2 positions shown · non-contrast
Comparison: [DATE] and earlier.

CLINICAL DATA: 44-year-old male with chest pain and tachycardia for
3 days. Dialysis patient. Abnormal potassium. Initial encounter.

EXAM:
CHEST  2 VIEW

[w chest pa]
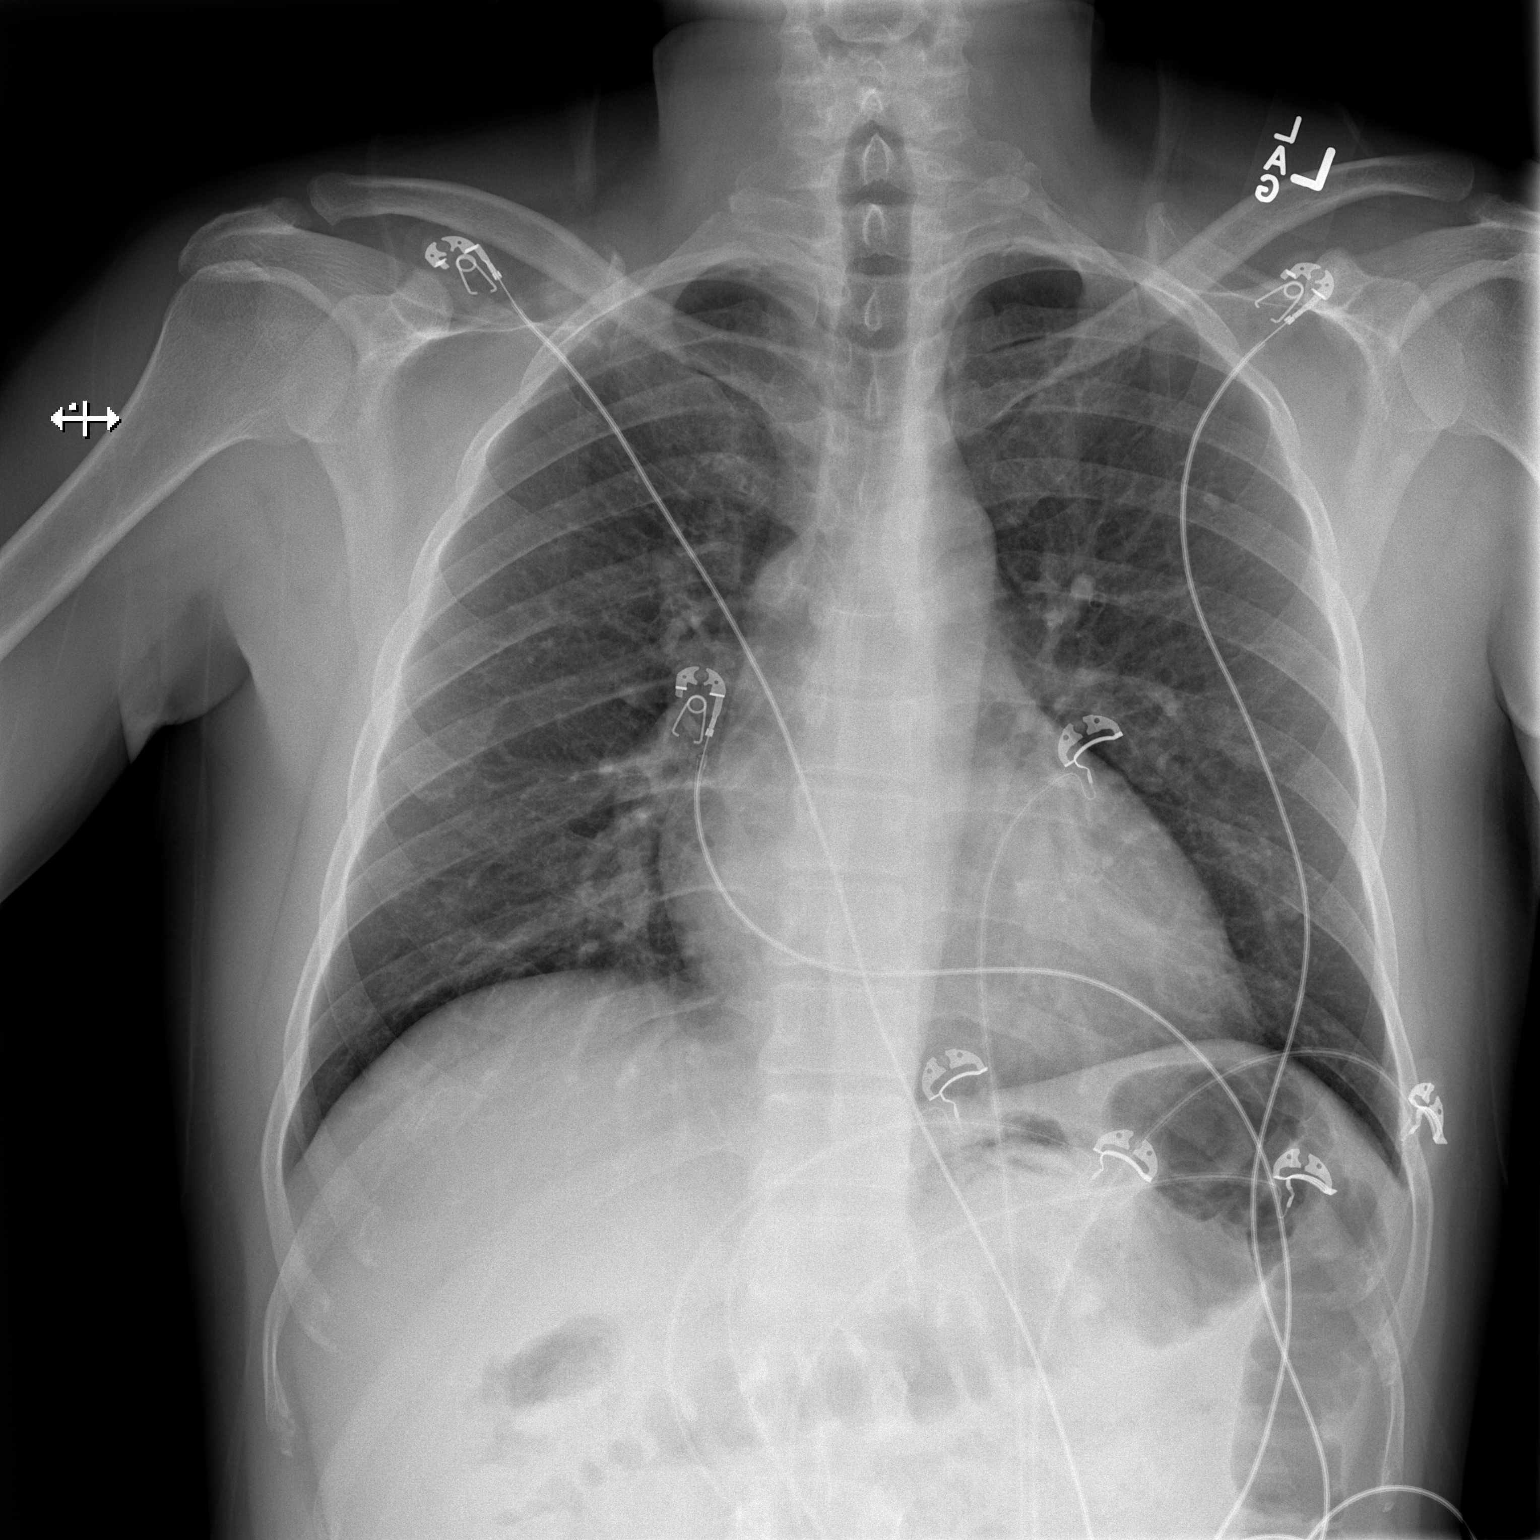

[w chest lat]
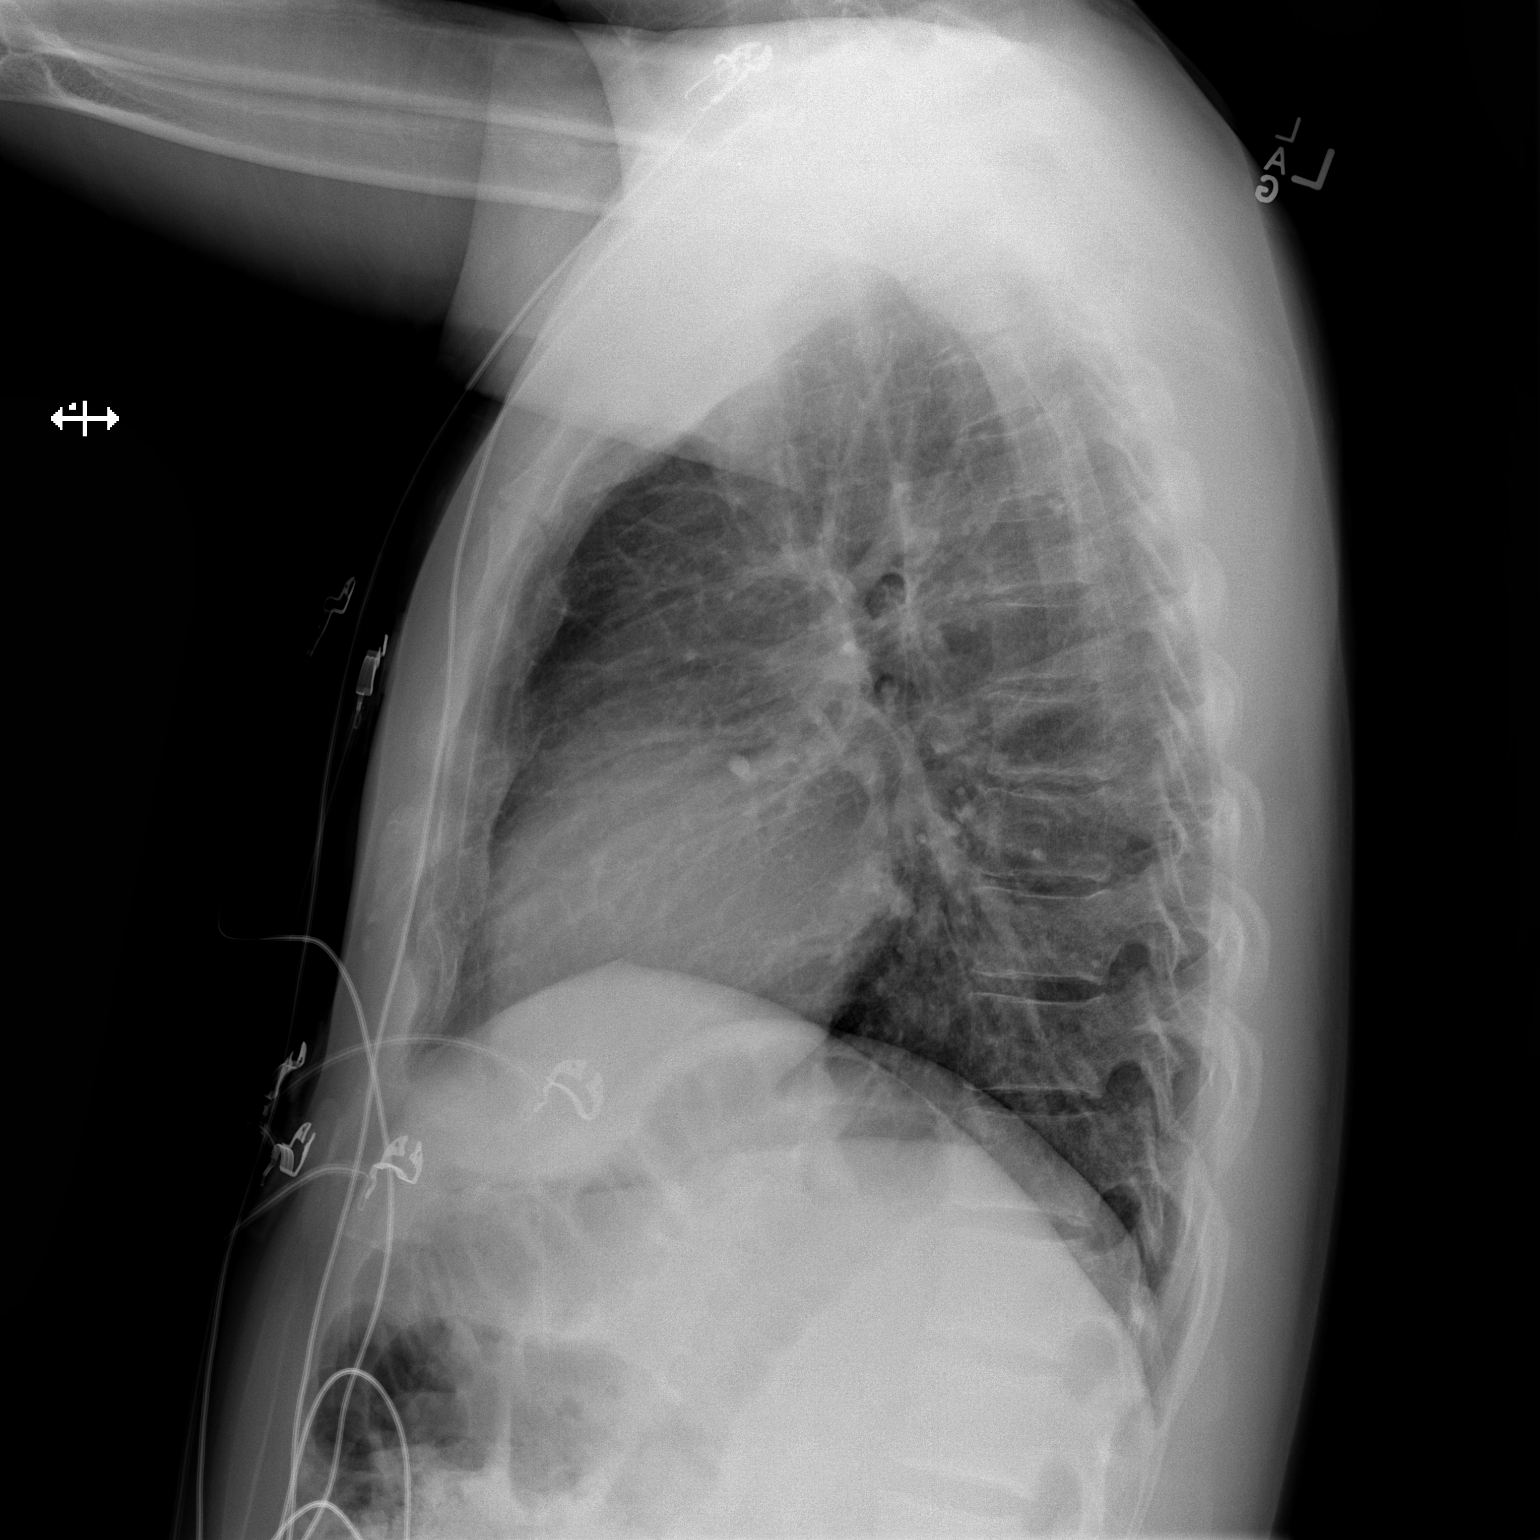

[2 of 2 positions shown; findings below may reference images not displayed]

FINDINGS: Normal lung volumes. Stable cardiac size at the upper limits of
normal to mildly enlarged. Other mediastinal contours are within
normal limits. Visualized tracheal air column is within normal
limits. Stable calcified granuloma in the left upper lobe. No
pneumothorax, pulmonary edema, pleural effusion or acute pulmonary
opacity. No acute osseous abnormality identified.
IMPRESSION: No acute cardiopulmonary abnormality.

## 2017-05-12 ENCOUNTER — Other Ambulatory Visit: Payer: Self-pay

## 2017-05-12 ENCOUNTER — Ambulatory Visit (HOSPITAL_COMMUNITY)
Admission: RE | Admit: 2017-05-12 | Discharge: 2017-05-12 | Disposition: A | Discharge status: Home / Self Care | Payer: Self-pay | Source: Ambulatory Visit | Attending: Family Medicine | Admitting: Family Medicine

## 2017-05-12 ENCOUNTER — Ambulatory Visit (INDEPENDENT_AMBULATORY_CARE_PROVIDER_SITE_OTHER): Payer: Self-pay | Admitting: Family Medicine

## 2017-05-12 ENCOUNTER — Encounter: Payer: Self-pay | Admitting: Family Medicine

## 2017-05-12 VITALS — BP 130/70 | HR 81 | Temp 98.3°F | Ht 61.0 in | Wt 143.4 lb

## 2017-05-12 DIAGNOSIS — M5412 Radiculopathy, cervical region: Secondary | ICD-10-CM

## 2017-05-12 DIAGNOSIS — N186 End stage renal disease: Secondary | ICD-10-CM

## 2017-05-12 DIAGNOSIS — M79601 Pain in right arm: Secondary | ICD-10-CM

## 2017-05-12 DIAGNOSIS — L299 Pruritus, unspecified: Secondary | ICD-10-CM

## 2017-05-12 MED ORDER — HYDROXYZINE HCL 25 MG PO TABS: 25.0000 mg | ORAL_TABLET | Freq: Every evening | ORAL | 0 refills | Status: DC | PRN

## 2017-05-12 NOTE — Patient Instructions (Signed)
It was a pleasure to see you today! Thank you for choosing [facility] for your primary care. Cole [name] was seen for arm pain, facial itching.   Our plans for today were:  Try the medicine before bed for itching.   Keep taking your other medicines.   Go get the XRAY for your arm pain.   You should return to our clinic to see Dr. Lindell Noe in 3 months for arm pain.   Best,  Dr. Lindell Noe

## 2017-05-12 NOTE — Progress Notes (Signed)
CC: new patient   HPI  Arm pain - dull, "deep in his bone," starts at right deltoid. Present x 3 months. No acute injury, no hx of neck problems, neck or arm surgery. Tylenol takes the pain away, but it comes back in 6-8 hours.  He states he feels like there is bone pain that sometimes radiates to his forearm, but not to fingers, no weakness. Present every day. 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   At end of visit, mentions facial itching x several weeks. No rash, just itching at night, sometimes inside his nose. Keeps him from falling asleep. Has tried lotrimin cream + hydrocortisone without help. Shaves in am.   Referred by: [facility] nephro  Medical history:  ESRD caused by kidney problems - he thinks due to dehydration, started 2013. Took pills for HTN for 8-9 years prior to HD.  HTN on coreg CHF (last echo 09/2014 w cath - EF 45% mild global hypokinesis)  ACD labs recently at [facility] on statins 8-9years prior to HD  sensipar hasnt been taking for 1 week because nausea, talked to nephro about this at his HD session  Brought meds today:  tums simva 20 norvasc 10 Coreg 6.25 Allopurinol renavite  Clonazepam 2mg  PRN cramping during HD  Surgical history: AV graft creation  Social history:  Lives with: his brother, SIL, niece Occupation:  Astronomer  Tobacco use: never Alcohol use: never Drug use: no   ROS: denies CP, SOB, abd pain, dysuria, rash, changes in BM.   CC, SH/smoking status, and VS noted  Objective: BP 130/70   Pulse 81   Temp 98.3 F (36.8 C) (Oral)   Ht 5\' 1"  (1.549 m)   Wt 143 lb 6.4 oz (65 kg)   SpO2 99%   BMI 27.10 kg/m  Gen: NAD, alert, cooperative, and pleasant. HEENT: NCAT, EOMI, PERRL. Clean shaven, no folliculitis or irritation.  CV: RRR, no murmur Resp: CTAB, no wheezes, non-labored Abd: SNTND, BS present, no guarding or organomegaly Ext: No edema, warm. R arm with full ROM without pain, no  pain to palpation over deltoid or forearm, sensation intact. Neuro: Alert and oriented, Speech clear, No gross deficits  Assessment and plan:  End stage renal disease MWF HD with Dr. Lorrene Reid. Patient signed ROI today to get labs from recent visit. Asked him to continue to discuss his c/w nausea, which he related to sensipar and ask Dr. Lorrene Reid if he needs to continue to take this vs switch to another binder.   Right arm pain Will obtain cervical XR to evaluation foramen. Will call patient to follow up with results.   Pruritus Facial itching, only at night x 2 weeks. Has tried lotrimin + hydrocortisone without relief. No rash on exam. Will try vistaril QHS and continue to discuss this at next visit.    Orders Placed This Encounter  Procedures  . DG Cervical Spine Complete    Standing Status:   Future    Number of Occurrences:   1    Standing Expiration Date:   07/13/2018    Order Specific Question:   Reason for Exam (SYMPTOM  OR DIAGNOSIS REQUIRED)    Answer:   radiculopathy R    Order Specific Question:   Preferred imaging location?    Answer:   [facility]    Order Specific Question:   Radiology Contrast Protocol - do NOT remove file path    Answer:   \\  [email or web address]    Meds ordered this encounter  Medications  . hydrOXYzine (ATARAX/VISTARIL) 25 MG tablet    Sig: Take 1 tablet (25 mg total) by mouth at bedtime as needed (itching).    Dispense:  30 tablet    Refill:  0    Health Maintenance: UTD  Ralene Ok, MD, PGY2 05/13/2017 9:59 AM

## 2017-05-13 ENCOUNTER — Telehealth: Payer: Self-pay | Admitting: Family Medicine

## 2017-05-13 DIAGNOSIS — M79601 Pain in right arm: Secondary | ICD-10-CM | POA: Insufficient documentation

## 2017-05-13 DIAGNOSIS — L299 Pruritus, unspecified: Secondary | ICD-10-CM | POA: Insufficient documentation

## 2017-05-13 NOTE — Assessment & Plan Note (Signed)
Facial itching, only at night x 2 weeks. Has tried lotrimin + hydrocortisone without relief. No rash on exam. Will try vistaril QHS and continue to discuss this at next visit.

## 2017-05-13 NOTE — Telephone Encounter (Signed)
Called via [employer] to discuss cervical XR. No answer, VM full.

## 2017-05-13 NOTE — Assessment & Plan Note (Signed)
Will obtain cervical XR to evaluation foramen. Will call patient to follow up with results.

## 2017-05-13 NOTE — Assessment & Plan Note (Signed)
MWF HD with Dr. Lorrene Reid. Patient signed ROI today to get labs from recent visit. Asked him to continue to discuss his c/w nausea, which he related to sensipar and ask Dr. Lorrene Reid if he needs to continue to take this vs switch to another binder.

## 2017-07-01 ENCOUNTER — Encounter: Payer: Self-pay | Admitting: Family Medicine

## 2017-07-12 ENCOUNTER — Ambulatory Visit: Payer: Self-pay | Admitting: Family Medicine

## 2017-08-23 ENCOUNTER — Ambulatory Visit (INDEPENDENT_AMBULATORY_CARE_PROVIDER_SITE_OTHER): Payer: Self-pay | Admitting: Family Medicine

## 2017-08-23 ENCOUNTER — Other Ambulatory Visit: Payer: Self-pay

## 2017-08-23 ENCOUNTER — Ambulatory Visit (HOSPITAL_COMMUNITY)
Admission: RE | Admit: 2017-08-23 | Discharge: 2017-08-23 | Disposition: A | Discharge status: Home / Self Care | Payer: Self-pay | Source: Ambulatory Visit | Attending: Family Medicine | Admitting: Family Medicine

## 2017-08-23 ENCOUNTER — Encounter: Payer: Self-pay | Admitting: Family Medicine

## 2017-08-23 VITALS — BP 122/64 | HR 81 | Temp 98.4°F | Ht 61.0 in | Wt 143.6 lb

## 2017-08-23 DIAGNOSIS — M79601 Pain in right arm: Secondary | ICD-10-CM | POA: Insufficient documentation

## 2017-08-23 MED ORDER — HYDROXYZINE HCL 25 MG PO TABS: 25.0000 mg | ORAL_TABLET | Freq: Every evening | ORAL | 0 refills | Status: DC | PRN

## 2017-08-23 NOTE — Patient Instructions (Addendum)
Please show patient to imaging (he speaks Spanish) to get shoulder xray.   It was a pleasure to see you today! Thank you for choosing [facility] for your primary care. Cole [name] was seen for arm pain.   Our plans for today were:  Your arm pain could be coming from your shoulder. Please go over to [facility] to get the Xray. You can continue to use the muscle run cream.   I will call you with Xray results.  Best,  Dr. Ignacia Marvel un placer verte hoy! Clayburn Pert por elegir [facility] para su atencin primaria. Cole [name] fue visto por dolor en el brazo.  Nuestros planes para hoy fueron: Su dolor en el brazo podra provenir de su hombro. Por favor ve a [facility] para obtener la radiografa. Puedes seguir usando la crema de carrera muscular. Te llamar con los resultados de [place].  Mejor, Dr. Lindell Noe

## 2017-08-23 NOTE — Progress Notes (Addendum)
   CC: arm pain  HPI  Arm pain - exactly the same since last visit. Has pain over lateral shoulder and upper arm. When it hurts more, it runs down his arm. Usually just over the lateral shoulder, struggles to sleep due to pain. Feels like burning. Family has cancer in their body, he is worried about this. PGF and dad and uncles have had bone cancer. On claficiation, PGF had stomach cancer, dad was lung in origin. Dad did not smoke. Has not injured this arm before. Dish washer part time. R handed. Worse after working.   Patient speaks spanish, asks me to call his niece who he lives with to relay XR results.   ROS: Denies CP, SOB, abdominal pain, dysuria, changes in BMs.   CC, SH/smoking status, and VS noted  Objective: BP 122/64   Pulse 81   Temp 98.4 F (36.9 C) (Oral)   Ht 5\' 1"  (1.549 m)   Wt 143 lb 10.1 oz (65.2 kg)   SpO2 98%   BMI 27.14 kg/m  Gen: NAD, alert, cooperative, and pleasant. HEENT: NCAT, EOMI, PERRL CV: RRR, no murmur Resp: CTAB, no wheezes, non-labored Abd: SNTND, BS present, no guarding or organomegaly Ext: No edema, warm. R arm TTP over insertion of deltoid on humerus, no overlying skin changes. Strength and sensation normal from shoulder down. No neck pain. States relief of pain with palpation of deltoid insertion and supination of his elbow.  Neuro: Alert and oriented, Speech clear, No gross deficits  Assessment and plan:  1. Right arm pain Unclear etiology, previously I mentioned neck for possible radiculopathy symptoms.  However this seems to start from the insertion of the deltoid onto the humerus.  Curious whether there is shoulder OA.  Patient is concerned as family has history of bone mets from other primary cancers, reassured him this is very less likely cancer but we would visualize any bone lesions on x-ray.  Personally reviewed old chest x-ray for any lung lesions as this was another of his concerns, read as normal   In 2016.  If shoulder x-ray is  unrevealing, will refer to sports medicine center for possible ultrasound.  Precepted with Dr. Nori Riis.  Considered etiology of calciphylaxis secondary to ESRD, this would also be visualized on x-ray. - DG Shoulder Right; Future   Ralene Ok, MD, PGY3 08/23/2017 10:16 AM

## 2017-08-25 ENCOUNTER — Telehealth: Payer: Self-pay | Admitting: Family Medicine

## 2017-08-25 DIAGNOSIS — M79601 Pain in right arm: Secondary | ICD-10-CM

## 2017-08-25 NOTE — Telephone Encounter (Signed)
Called patient to discuss normal shoulder x-ray.  As my discussion with Dr. Nori Riis, will refer to sports medicine for possible ultrasound to assess for etiology of arm pain.  I left a message with his friend to have him return or call to discuss results.  If he calls back please let him know that his shoulder x-ray was normal, and that we will refer him to [facility] sports medicine, [place] from our office, to further assess this with possible ultrasound.

## 2017-09-02 ENCOUNTER — Ambulatory Visit: Payer: Self-pay | Admitting: Sports Medicine

## 2017-11-24 ENCOUNTER — Other Ambulatory Visit (HOSPITAL_COMMUNITY): Payer: Self-pay | Admitting: Nephrology

## 2017-11-24 DIAGNOSIS — N186 End stage renal disease: Secondary | ICD-10-CM

## 2017-11-25 ENCOUNTER — Other Ambulatory Visit: Payer: Self-pay

## 2017-11-25 ENCOUNTER — Ambulatory Visit (HOSPITAL_COMMUNITY)
Admission: RE | Admit: 2017-11-25 | Discharge: 2017-11-25 | Disposition: A | Discharge status: Home / Self Care | Payer: Self-pay | Source: Ambulatory Visit | Attending: Nephrology | Admitting: Nephrology

## 2017-11-25 ENCOUNTER — Encounter (HOSPITAL_COMMUNITY): Payer: Self-pay

## 2017-11-25 DIAGNOSIS — N186 End stage renal disease: Secondary | ICD-10-CM | POA: Insufficient documentation

## 2017-11-25 DIAGNOSIS — Y832 Surgical operation with anastomosis, bypass or graft as the cause of abnormal reaction of the patient, or of later complication, without mention of misadventure at the time of the procedure: Secondary | ICD-10-CM | POA: Insufficient documentation

## 2017-11-25 DIAGNOSIS — T82590A Other mechanical complication of surgically created arteriovenous fistula, initial encounter: Secondary | ICD-10-CM | POA: Insufficient documentation

## 2017-11-25 HISTORY — PX: IR DIALY SHUNT INTRO NEEDLE/INTRACATH INITIAL W/IMG LEFT: IMG6102

## 2017-11-25 MED ORDER — IOPAMIDOL (ISOVUE-300) INJECTION 61%
INTRAVENOUS | Status: AC
  Administered 2017-11-25: 48 mL
  Filled 2017-11-25: qty 100

## 2018-01-10 ENCOUNTER — Other Ambulatory Visit: Payer: Self-pay

## 2018-01-10 ENCOUNTER — Emergency Department (HOSPITAL_COMMUNITY)
Admission: EM | Admit: 2018-01-10 | Discharge: 2018-01-10 | Disposition: A | Discharge status: Home / Self Care | Payer: Self-pay | Attending: Emergency Medicine | Admitting: Emergency Medicine

## 2018-01-10 ENCOUNTER — Emergency Department (HOSPITAL_COMMUNITY): Payer: Self-pay

## 2018-01-10 ENCOUNTER — Encounter (HOSPITAL_COMMUNITY): Payer: Self-pay | Admitting: *Deleted

## 2018-01-10 DIAGNOSIS — N189 Chronic kidney disease, unspecified: Secondary | ICD-10-CM | POA: Insufficient documentation

## 2018-01-10 DIAGNOSIS — I132 Hypertensive heart and chronic kidney disease with heart failure and with stage 5 chronic kidney disease, or end stage renal disease: Secondary | ICD-10-CM | POA: Insufficient documentation

## 2018-01-10 DIAGNOSIS — I480 Paroxysmal atrial fibrillation: Secondary | ICD-10-CM | POA: Insufficient documentation

## 2018-01-10 DIAGNOSIS — N186 End stage renal disease: Secondary | ICD-10-CM | POA: Insufficient documentation

## 2018-01-10 DIAGNOSIS — Z79899 Other long term (current) drug therapy: Secondary | ICD-10-CM | POA: Insufficient documentation

## 2018-01-10 DIAGNOSIS — I5020 Unspecified systolic (congestive) heart failure: Secondary | ICD-10-CM | POA: Insufficient documentation

## 2018-01-10 DIAGNOSIS — Z7982 Long term (current) use of aspirin: Secondary | ICD-10-CM | POA: Insufficient documentation

## 2018-01-10 LAB — BASIC METABOLIC PANEL
Anion gap: 17 — ABNORMAL HIGH (ref 5–15)
BUN: 32 mg/dL — ABNORMAL HIGH (ref 6–20)
CO2: 29 mmol/L (ref 22–32)
Calcium: 8 mg/dL — ABNORMAL LOW (ref 8.9–10.3)
Chloride: 88 mmol/L — ABNORMAL LOW (ref 98–111)
Creatinine, Ser: 6.94 mg/dL — ABNORMAL HIGH (ref 0.61–1.24)
GFR calc Af Amer: 10 mL/min — ABNORMAL LOW (ref >60)
GFR calc non Af Amer: 9 mL/min — ABNORMAL LOW (ref >60)
Glucose, Bld: 157 mg/dL — ABNORMAL HIGH (ref 70–99)
Potassium: 3.6 mmol/L (ref 3.5–5.1)
Sodium: 134 mmol/L — ABNORMAL LOW (ref 135–145)

## 2018-01-10 LAB — CBC
HCT: 34 % — ABNORMAL LOW (ref 39.0–52.0)
Hemoglobin: 10.9 g/dL — ABNORMAL LOW (ref 13.0–17.0)
MCH: 27.1 pg (ref 26.0–34.0)
MCHC: 32.1 g/dL (ref 30.0–36.0)
MCV: 84.6 fL (ref 80.0–100.0)
Platelets: 146 10*3/uL — ABNORMAL LOW (ref 150–400)
RBC: 4.02 MIL/uL — ABNORMAL LOW (ref 4.22–5.81)
RDW: 14.9 % (ref 11.5–15.5)
WBC: 5.4 10*3/uL (ref 4.0–10.5)
nRBC: 0 % (ref 0.0–0.2)

## 2018-01-10 LAB — I-STAT TROPONIN, ED: Troponin i, poc: 0.03 ng/mL (ref 0.00–0.08)

## 2018-01-10 LAB — TSH: TSH: 1.01 u[IU]/mL (ref 0.350–4.500)

## 2018-01-10 LAB — MAGNESIUM: Magnesium: 2.1 mg/dL (ref 1.7–2.4)

## 2018-01-10 LAB — T4, FREE: Free T4: 0.94 ng/dL (ref 0.82–1.77)

## 2018-01-10 MED ORDER — ASPIRIN 81 MG PO CHEW
81.0000 mg | CHEWABLE_TABLET | Freq: Once | ORAL | Status: AC
  Administered 2018-01-10: 81 mg via ORAL
  Filled 2018-01-10: qty 1

## 2018-01-10 NOTE — ED Provider Notes (Signed)
[facility] EMERGENCY DEPARTMENT Provider Note   CSN: [id number] Arrival date & time: 01/10/18  1907     History   Chief Complaint Chief Complaint  Patient presents with  . Chest Pain    HPI Cole [name] with history of ESRD on dialysis Monday Wednesday Friday at [facility], hypertension, hyperlipidemia is here for evaluation of chest pressure.  Onset immediately after finishing dialysis earlier this morning.  Associated with palpitations.  Episode lasted no more than 30 minutes.  His heart felt like he had just been running.  Currently he is asymptomatic.  He denies associated nausea, vomiting, diaphoresis, lightheadedness, syncope, abdominal pain.  He is not on any anticoagulants.  No interventions.  No alleviating or aggravating factors.  No history of DVT/PE.  No recent prolonged travel, immobilization, surgery, leg swelling or calf pain.  HPI  Past Medical History:  Diagnosis Date  . Chronic kidney disease   . Constipation   . GERD (gastroesophageal reflux disease)   . Hypercholesteremia   . Hypertension   . Renal failure    on dialysis M/W/F    Patient Active Problem List   Diagnosis Date Noted  . Right arm pain 05/13/2017  . Pruritus 05/13/2017  . Systolic congestive heart failure ([place]) 10/16/2014  . Essential hypertension 09/21/2014  . Dyslipidemia 09/21/2014  . End stage renal disease ([place]) 11/17/2011    Past Surgical History:  Procedure Laterality Date  . AV FISTULA PLACEMENT  10/05/2011   Procedure: ARTERIOVENOUS (AV) FISTULA CREATION;  Surgeon: Rosetta Posner, MD;  Location: [place];  Service: Vascular;  Laterality: Left;  . CARDIAC CATHETERIZATION N/A 10/23/2014   Procedure: Right/Left Heart Cath and Coronary Angiography;  Surgeon: Belva Crome, MD;  Location: [place] CV LAB;  Service: Cardiovascular;  Laterality: N/A;  . INSERTION OF DIALYSIS CATHETER  10/05/2011   Procedure: INSERTION OF DIALYSIS CATHETER;   Surgeon: Rosetta Posner, MD;  Location: [place];  Service: Vascular;  Laterality: N/A;  right internal jugular vein  . IR DIALY SHUNT INTRO NEEDLE/INTRACATH INITIAL W/IMG LEFT Left 11/25/2017  . NO PAST SURGERIES    . REVISON OF ARTERIOVENOUS FISTULA Left 2/0/9470   Procedure: PLICATION OF LEFT RADIOCEPHALIC ARTERIOVENOUS FISTULA -PSEUDOANEURYSM TIMES TWO;  Surgeon: Conrad [place], MD;  Location: [place];  Service: Vascular;  Laterality: Left;        Home Medications    Prior to Admission medications   Medication Sig Start Date End Date Taking? Authorizing Provider  allopurinol (ZYLOPRIM) 100 MG tablet Take 200 mg by mouth daily.   Yes [provider]  amLODipine (NORVASC) 10 MG tablet Take 1 tablet (10 mg total) by mouth daily. 10/14/13  Yes Noe Gens, PA-C  aspirin 81 MG tablet Take 81 mg by mouth daily.   Yes [provider]  calcium elemental as carbonate (TUMS ULTRA 1000) 400 MG tablet Chew 1,600 mg by mouth 3 (three) times daily with meals.    Yes [provider]  carvedilol (COREG) 6.25 MG tablet Take 1 tablet (6.25 mg total) by mouth 2 (two) times daily. 10/16/14  Yes Hilty, Nadean Corwin, MD  clonazePAM (KLONOPIN) 2 MG tablet Take 2 mg by mouth daily.    Yes [provider]  multivitamin (RENA-VIT) TABS tablet Take 1 tablet by mouth at bedtime. 10/10/11  Yes Zeyfang, Shanon Brow, PA-C  simvastatin (ZOCOR) 20 MG tablet Take 20 mg by mouth every evening.   Yes [provider]  hydrOXYzine (ATARAX/VISTARIL) 25 MG tablet Take 1 tablet (25 mg total) by mouth at bedtime as needed (itching). Patient not taking: Reported on 01/10/2018 08/23/17   Sela Hilding, MD    Family History Family History  Problem Relation Age of Onset  . Hypertension Mother   . Lung cancer Father     Social History Social History   Tobacco Use  . Smoking status: Never Smoker  . Smokeless tobacco: Never Used  Substance Use Topics  . Alcohol use: No    Alcohol/week:  0.0 standard drinks  . Drug use: No     Allergies   Bactrim [sulfamethoxazole-trimethoprim]   Review of Systems Review of Systems  Cardiovascular: Positive for chest pain and palpitations.  All other systems reviewed and are negative.    Physical Exam Updated Vital Signs BP (!) 166/99   Pulse 68   Temp 98.4 F (36.9 C) (Oral)   Resp 20   Ht 5\' 2"  (1.575 m)   Wt 63.3 kg   SpO2 99%   BMI 25.52 kg/m   Physical Exam Vitals signs and nursing note reviewed.  Constitutional:      Appearance: He is well-developed.     Comments: Non toxic.  HENT:     Head: Normocephalic and atraumatic.     Nose: Nose normal.  Eyes:     Conjunctiva/sclera: Conjunctivae normal.     Pupils: Pupils are equal, round, and reactive to light.  Neck:     Musculoskeletal: Normal range of motion.  Cardiovascular:     Rate and Rhythm: Normal rate and regular rhythm.     Heart sounds: Normal heart sounds.     Comments: RRR.  1+ DP and radial pulses bilaterally.  No lower extremity edema.  No calf tenderness. Pulmonary:     Effort: Pulmonary effort is normal.     Breath sounds: Normal breath sounds.  Abdominal:     General: Bowel sounds are normal.     Palpations: Abdomen is soft.     Tenderness: There is no abdominal tenderness.     Comments: No G/R/R. No suprapubic or CVA tenderness. Negative Murphy's and McBurney's  Musculoskeletal: Normal range of motion.  Skin:    General: Skin is warm and dry.     Capillary Refill: Capillary refill takes less than 2 seconds.  Neurological:     Mental Status: He is alert and oriented to person, place, and time.  Psychiatric:        Behavior: Behavior normal.        Thought Content: Thought content normal.        Judgment: Judgment normal.      ED Treatments / Results  Labs (all labs ordered are listed, but only abnormal results are displayed) Labs Reviewed  BASIC METABOLIC PANEL - Abnormal; Notable for the following components:      Result Value    Sodium 134 (*)    Chloride 88 (*)    Glucose, Bld 157 (*)    BUN 32 (*)    Creatinine, Ser 6.94 (*)    Calcium 8.0 (*)    GFR calc non Af Amer 9 (*)    GFR calc Af Amer 10 (*)    Anion gap 17 (*)    All other components within normal limits  CBC - Abnormal; Notable for the following components:   RBC 4.02 (*)    Hemoglobin 10.9 (*)    HCT 34.0 (*)    Platelets 146 (*)    All  other components within normal limits  MAGNESIUM  TSH  T4, FREE  I-STAT TROPONIN, ED    EKG None   Radiology Dg Chest 2 View  Result Date: 01/10/2018 CLINICAL DATA:  47 y/o  M; chest pain and racing heart. EXAM: CHEST - 2 VIEW COMPARISON:  11/07/2014 chest radiograph. FINDINGS: Stable heart size and mediastinal contours are within normal limits given projection and technique. Both lungs are clear. The visualized skeletal structures are unremarkable. IMPRESSION: No acute pulmonary process identified. Electronically Signed   By: Kristine Garbe M.D.   On: 01/10/2018 20:38    Procedures Procedures (including critical care time)  Medications Ordered in ED Medications  aspirin chewable tablet 81 mg (81 mg Oral Given 01/10/18 2235)     Initial Impression / Assessment and Plan / ED Course  I have reviewed the triage vital signs and the nursing notes.  Pertinent labs & imaging results that were available during my care of the patient were reviewed by me and considered in my medical decision making (see chart for details).      Patient arrives with atrial fibrillation rate controlled initial EKG shows a-fib/flutter HR 94 with LVH.  Upon reevaluation he is back in normal sinus rhythm with EKG NSR HR 73.  Onset of atrial fibrillation is unclear as he is sometimes asymptomatic while on atrial fibrillation.  He may have been in paroxysmal a-fib for more than 48 hours.  Recent dialysis session could have been trigger as well. He is hemodynamically stable.  No indication for emergent cardioversion.   Work-up today is reassuring.  His episode of chest pain was more than 8 hours ago and I do not think there is need for repeat troponin. Pt had cardiology work up in 2016. He initially had a nuclear stress test that was markedly abnormal stress test considered high risk. This was followed by heart cath that deemed it a false positive study, recommendations by cardiology included HTN control and volume control by HD.  CHADs score = 1 for HTN.  I spoke to cardiology fellow who recommended antiplatelet management and f/u in atrial fibrillation.  Discussed this with patient via [place] interpreter.  Lengthy discussion of atrial fibrillation, risk of clot, importance of f/u in clinic.  Return preacutions given. Pt in agreement. Discussed with Dr. Maryan Rued.   CHA2DS2/VAS Stroke Risk Points  Current as of 48 minutes ago     2 >= 2 Points: High Risk  1 - 1.99 Points: Medium Risk  0 Points: Low Risk    The patient's score has not changed in the past year.:  No Change     Details    This score determines the patient's risk of having a stroke if the  patient has atrial fibrillation.       Points Metrics  1 Has Congestive Heart Failure:  No  Current as of 48 minutes ago  0 Has Vascular Disease:  No    Current as of 48 minutes ago  1 Has Hypertension:  Yes    Current as of 48 minutes ago  0 Age:  17    Current as of 48 minutes ago  0 Has Diabetes:  No    Current as of 48 minutes ago  0 Had Stroke:  No  Had TIA:  No  Had thromboembolism:  No    Current as of 48 minutes ago  0 Male:  No    Current as of 48 minutes ago  Final Clinical Impressions(s) / ED Diagnoses   Final diagnoses:  Paroxysmal atrial fibrillation [facility])    ED Discharge Orders         Ordered    Amb referral to AFIB Clinic     01/10/18 2213           Kinnie Feil, PA-C 01/10/18 2340    Blanchie Dessert, MD 01/11/18 0006

## 2018-01-10 NOTE — ED Notes (Signed)
Patient transported to X-ray 

## 2018-01-10 NOTE — Discharge Instructions (Addendum)
You were seen in the ER for palpitations and chest discomfort.  You have atrial fibrillation that comes and goes.  This is called paroxysmal atrial fibrillation.  At this time treatment includes antiplatelet medicine, aspirin.  You need to take aspirin 81 mg every day.  You need to follow-up with atrial fibrillation clinic as outpatient.  They should call you in the next few days to set up an appointment.  You may experience intermittent palpitations as your heart is going in and out of atrial fibrillation.  Return to the ER for chest pain or shortness of breath with exertion, worsening palpitations, lightheadedness, fainting episodes and's.

## 2018-01-10 NOTE — ED Triage Notes (Addendum)
Pt arrives from dialysis (pt was able to finish treatment) via EMS, onset of chest pain and heart racing. Last BP 133/84, hr 79-148 (afib) no history of the same. No IV established.  Pain initially 6/10, 1 nitro and 324 ASA given, pain came down to 4/10

## 2018-01-11 ENCOUNTER — Telehealth (HOSPITAL_COMMUNITY): Payer: Self-pay | Admitting: *Deleted

## 2018-01-11 NOTE — Telephone Encounter (Signed)
LMOM on vcml of Cole [name].  Pt mailbox was full.  Pt referred from ED

## 2018-01-21 ENCOUNTER — Ambulatory Visit (HOSPITAL_COMMUNITY)
Admission: RE | Admit: 2018-01-21 | Discharge: 2018-01-21 | Disposition: A | Discharge status: Home / Self Care | Payer: Self-pay | Source: Ambulatory Visit | Attending: Nurse Practitioner | Admitting: Nurse Practitioner

## 2018-01-21 ENCOUNTER — Encounter (HOSPITAL_COMMUNITY): Payer: Self-pay | Admitting: Nurse Practitioner

## 2018-01-21 VITALS — BP 164/84 | HR 74 | Ht 62.0 in | Wt 142.0 lb

## 2018-01-21 DIAGNOSIS — I120 Hypertensive chronic kidney disease with stage 5 chronic kidney disease or end stage renal disease: Secondary | ICD-10-CM | POA: Insufficient documentation

## 2018-01-21 DIAGNOSIS — Z882 Allergy status to sulfonamides status: Secondary | ICD-10-CM | POA: Insufficient documentation

## 2018-01-21 DIAGNOSIS — Z7982 Long term (current) use of aspirin: Secondary | ICD-10-CM | POA: Insufficient documentation

## 2018-01-21 DIAGNOSIS — Z8249 Family history of ischemic heart disease and other diseases of the circulatory system: Secondary | ICD-10-CM | POA: Insufficient documentation

## 2018-01-21 DIAGNOSIS — Z801 Family history of malignant neoplasm of trachea, bronchus and lung: Secondary | ICD-10-CM | POA: Insufficient documentation

## 2018-01-21 DIAGNOSIS — I480 Paroxysmal atrial fibrillation: Secondary | ICD-10-CM | POA: Insufficient documentation

## 2018-01-21 DIAGNOSIS — Z992 Dependence on renal dialysis: Secondary | ICD-10-CM | POA: Insufficient documentation

## 2018-01-21 DIAGNOSIS — Z79899 Other long term (current) drug therapy: Secondary | ICD-10-CM | POA: Insufficient documentation

## 2018-01-21 DIAGNOSIS — N186 End stage renal disease: Secondary | ICD-10-CM | POA: Insufficient documentation

## 2018-01-21 DIAGNOSIS — E7800 Pure hypercholesterolemia, unspecified: Secondary | ICD-10-CM | POA: Insufficient documentation

## 2018-01-21 MED ORDER — CARVEDILOL 6.25 MG PO TABS: 6.2500 mg | ORAL_TABLET | ORAL | 0 refills | Status: AC

## 2018-01-21 NOTE — Progress Notes (Addendum)
Primary Care Physician: Sela Hilding, MD Referring Physician:[facility]  Cardiologist: Dr. Francee Gentile [name] with a h/o ESRD on dialysis, HTN, that presented to the ER for chest discomfort, heart racing after dialysis 12/16. He was initially found to be in afib that resolved in the ER. He is now being seen in the afib clinic. He is with a interpretor. He is in  SR and has not had anymore issues with afib. There is some confusion with his doses of coreg. He was on 6.125 mg bid and some provider, ? maybe at dialysis, gave him RX  for 12.5 mg bid. He did not know what dose to use so he has been taking 6.125 mg in am and the higher dose in the pm. He denies any excessive use of  caffeine use, no alcohol, no tobacco. He denies any snoring.  Today, he denies symptoms of palpitations, chest pain, shortness of breath, orthopnea, PND, lower extremity edema, dizziness, presyncope, syncope, or neurologic sequela. The patient is tolerating medications without difficulties and is otherwise without complaint today.   Past Medical History:  Diagnosis Date  . Chronic kidney disease   . Constipation   . GERD (gastroesophageal reflux disease)   . Hypercholesteremia   . Hypertension   . Renal failure    on dialysis M/W/F   Past Surgical History:  Procedure Laterality Date  . AV FISTULA PLACEMENT  10/05/2011   Procedure: ARTERIOVENOUS (AV) FISTULA CREATION;  Surgeon: Rosetta Posner, MD;  Location: [place];  Service: Vascular;  Laterality: Left;  . CARDIAC CATHETERIZATION N/A 10/23/2014   Procedure: Right/Left Heart Cath and Coronary Angiography;  Surgeon: Belva Crome, MD;  Location: [place] [place] [facility];  Service: Cardiovascular;  Laterality: N/A;  . INSERTION OF DIALYSIS CATHETER  10/05/2011   Procedure: INSERTION OF DIALYSIS CATHETER;  Surgeon: Rosetta Posner, MD;  Location: [place];  Service: Vascular;  Laterality: N/A;  right internal jugular vein  . IR DIALY SHUNT INTRO  NEEDLE/INTRACATH INITIAL W/IMG LEFT Left 11/25/2017  . NO PAST SURGERIES    . REVISON OF ARTERIOVENOUS FISTULA Left 01/31/1094   Procedure: PLICATION OF LEFT RADIOCEPHALIC ARTERIOVENOUS FISTULA -PSEUDOANEURYSM TIMES TWO;  Surgeon: Conrad Cerro [place]ordo, MD;  Location: [place];  Service: Vascular;  Laterality: Left;    Current Outpatient Medications  Medication Sig Dispense Refill  . allopurinol (ZYLOPRIM) 100 MG tablet Take 200 mg by mouth daily.    Marland Kitchen amLODipine (NORVASC) 10 MG tablet Take 1 tablet (10 mg total) by mouth daily. 30 tablet 0  . aspirin 81 MG tablet Take 81 mg by mouth daily.    . calcitRIOL (ROCALTROL) 0.5 MCG capsule Take 0.5 mcg by mouth as directed. 3 x a week on dialysis days-given during dialysis    . calcium elemental as carbonate (TUMS ULTRA 1000) 400 MG tablet Chew 1,600 mg by mouth 3 (three) times daily with meals.     . carvedilol (COREG) 12.5 MG tablet Take 12.5 mg by mouth 2 (two) times daily with a meal. Do not take on dialysis    . carvedilol (COREG) 6.25 MG tablet Take 1 tablet (6.25 mg total) by mouth as directed. As needed- for breakthrough afib 30 tablet 0  . clonazePAM (KLONOPIN) 2 MG tablet Take 2 mg by mouth daily.     . multivitamin (RENA-VIT) TABS tablet Take 1 tablet by mouth at bedtime.    . simvastatin (ZOCOR) 20 MG tablet Take 20 mg by  mouth every evening.    . hydrOXYzine (ATARAX/VISTARIL) 25 MG tablet Take 1 tablet (25 mg total) by mouth at bedtime as needed (itching). (Patient not taking: Reported on 01/10/2018) 30 tablet 0   No current facility-administered medications for this encounter.     Allergies  Allergen Reactions  . Bactrim [Sulfamethoxazole-Trimethoprim] Nausea And Vomiting    Social History   Socioeconomic History  . Marital status: Single    Spouse name: Not on file  . Number of children: Not on file  . Years of education: Not on file  . Highest education level: Not on file  Occupational History  . Not on file  Social Needs  .  Financial resource strain: Not on file  . Food insecurity:    Worry: Not on file    Inability: Not on file  . Transportation needs:    Medical: Not on file    Non-medical: Not on file  Tobacco Use  . Smoking status: Never Smoker  . Smokeless tobacco: Never Used  Substance and Sexual Activity  . Alcohol use: No    Alcohol/week: 0.0 standard drinks  . Drug use: No  . Sexual activity: Yes    Birth control/protection: None  Lifestyle  . Physical activity:    Days per week: Not on file    Minutes per session: Not on file  . Stress: Not on file  Relationships  . Social connections:    Talks on phone: Not on file    Gets together: Not on file    Attends religious service: Not on file    Active member of club or organization: Not on file    Attends meetings of clubs or organizations: Not on file    Relationship status: Not on file  . Intimate partner violence:    Fear of current or ex partner: Not on file    Emotionally abused: Not on file    Physically abused: Not on file    Forced sexual activity: Not on file  Other Topics Concern  . Not on file  Social History Narrative  . Not on file    Family History  Problem Relation Age of Onset  . Hypertension Mother   . Lung cancer Father     ROS- All systems are reviewed and negative except as per the HPI above  Physical Exam: Vitals:   01/21/18 0910  BP: (!) 164/84  Pulse: 74  Weight: 64.4 kg  Height: 5\' 2"  (1.575 m)   Wt Readings from Last 3 Encounters:  01/21/18 64.4 kg  01/10/18 63.3 kg  11/25/17 64 kg    Labs: Lab Results  Component Value Date   NA 134 (L) 01/10/2018   K 3.6 01/10/2018   CL 88 (L) 01/10/2018   CO2 29 01/10/2018   GLUCOSE 157 (H) 01/10/2018   BUN 32 (H) 01/10/2018   CREATININE 6.94 (H) 01/10/2018   CALCIUM 8.0 (L) 01/10/2018   PHOS 6.4 (H) 10/09/2011   MG 2.1 01/10/2018   Lab Results  Component Value Date   INR 0.99 10/16/2014   No results found for: CHOL, HDL, LDLCALC,  TRIG   GEN- The patient is well appearing, alert and oriented x 3 today.   Head- normocephalic, atraumatic Eyes-  Sclera clear, conjunctiva pink Ears- hearing intact Oropharynx- clear Neck- supple, no JVP Lymph- no cervical lymphadenopathy Lungs- Clear to ausculation bilaterally, normal work of breathing Heart- Regular rate and rhythm, no murmurs, rubs or gallops, PMI not laterally displaced GI- soft, NT,  ND, + BS Extremities- no clubbing, cyanosis, or edema MS- no significant deformity or atrophy Skin- no rash or lesion Psych- euthymic mood, full affect Neuro- strength and sensation are intact  EKG-SR at 74 bpm, PR int 214 ms, qrs int 92 ms, qtc 444 ms Epic records reviewed    Assessment and Plan: 1. Paroxysmal afib  In SR today General education re afib and triggers Increase coreg to 12.5 mg bid, can take an extra 6.125 mg for breakthrough afib For now will stay on ASA for CHA2DS2VASc score of 1 Will obtain an echo, if EF is reduced will have to be considered for anticoagulation with then a  CHA2DS2VASc of 2  2. HTN Elevated today Pt states it varies with dialysis days and can be very low following dialysis which is due today  I will request f/u with Dr. Debara Pickett in 3-4 weeks  to review echo with pt  Butch Penny C. Harumi Yamin, [facility] [address] [place], [place] [address] [phone]

## 2018-01-21 NOTE — Patient Instructions (Addendum)
Tome Carvedilol 12.5 mg dos veces al da [employer] de dilisis  Mantenga el 6.25 tallado a mano para los fines necesarios para la Oncologist, pero NO TOME todos [place].  Hemos pedido un ecocardiograma.  Est programado para el lunes 01/31/2017 a las 8:00 a.m.  Hilty ha [employer].  Esto puede tomar una semana ms o menos para que llamen para programar

## 2018-01-24 ENCOUNTER — Telehealth (HOSPITAL_COMMUNITY): Payer: Self-pay | Admitting: Licensed Clinical Social Worker

## 2018-01-24 ENCOUNTER — Telehealth (HOSPITAL_COMMUNITY): Payer: Self-pay | Admitting: *Deleted

## 2018-01-24 NOTE — Telephone Encounter (Signed)
lft msg for [facility]bk to find out if pt has insurance since scheduled for Echo 01/31/2018.  Pt does not have any insurance

## 2018-01-24 NOTE — Telephone Encounter (Signed)
CSW received referral stating that pt is currently uninsured and in need of coverage for upcoming testing.  CSW completed chart review and unable to find evidence of insurance coverage.  CSW called financial counselor to inquire if they are following this patient for any assistance programs.  CSW saw pt on dialysis- usually this would qualify pt for medicaid and per referral pt used to have medicaid.  CSW called pt dialysis center ([facility] on [address]) and spoke with Education officer, museum.  CSW reports that pt has Emergency Medicaid according to their records but no other form.  CSW called DSS and was informed pt Medicaid case worker is Rae Roam [phone]- message left to inquire what is preventing pt from normal medicaid.  CSW will continue to follow and assist as needed  Jorge Ny, LCSW Clinical Social Worker [facility] [phone]

## 2018-01-24 NOTE — Telephone Encounter (Signed)
Pt to leave echo as scheduled.

## 2018-01-31 ENCOUNTER — Ambulatory Visit (HOSPITAL_COMMUNITY)
Admission: RE | Admit: 2018-01-31 | Discharge: 2018-01-31 | Disposition: A | Discharge status: Home / Self Care | Payer: Self-pay | Source: Ambulatory Visit | Attending: Nurse Practitioner | Admitting: Nurse Practitioner

## 2018-01-31 DIAGNOSIS — I480 Paroxysmal atrial fibrillation: Secondary | ICD-10-CM | POA: Insufficient documentation

## 2018-01-31 NOTE — Progress Notes (Signed)
  Echocardiogram 2D Echocardiogram has been performed.  Jennette Dubin 01/31/2018, 8:44 AM

## 2018-02-03 ENCOUNTER — Encounter (HOSPITAL_COMMUNITY): Payer: Self-pay | Admitting: *Deleted

## 2018-07-20 ENCOUNTER — Other Ambulatory Visit (HOSPITAL_COMMUNITY): Payer: Self-pay | Admitting: Nephrology

## 2018-07-20 DIAGNOSIS — N186 End stage renal disease: Secondary | ICD-10-CM

## 2018-07-22 ENCOUNTER — Ambulatory Visit (HOSPITAL_COMMUNITY): Admission: RE | Admit: 2018-07-22 | Payer: Self-pay | Source: Ambulatory Visit

## 2018-07-25 ENCOUNTER — Other Ambulatory Visit: Payer: Self-pay | Admitting: Student

## 2018-07-25 ENCOUNTER — Other Ambulatory Visit: Payer: Self-pay | Admitting: Radiology

## 2018-07-26 ENCOUNTER — Ambulatory Visit (HOSPITAL_COMMUNITY)
Admission: RE | Admit: 2018-07-26 | Discharge: 2018-07-26 | Disposition: A | Discharge status: Home / Self Care | Payer: Self-pay | Source: Ambulatory Visit | Attending: Nephrology | Admitting: Nephrology

## 2018-07-26 ENCOUNTER — Other Ambulatory Visit (HOSPITAL_COMMUNITY): Payer: Self-pay | Admitting: Nephrology

## 2018-07-26 ENCOUNTER — Other Ambulatory Visit: Payer: Self-pay

## 2018-07-26 ENCOUNTER — Encounter (HOSPITAL_COMMUNITY): Payer: Self-pay

## 2018-07-26 DIAGNOSIS — T82858A Stenosis of vascular prosthetic devices, implants and grafts, initial encounter: Secondary | ICD-10-CM | POA: Insufficient documentation

## 2018-07-26 DIAGNOSIS — Z992 Dependence on renal dialysis: Secondary | ICD-10-CM | POA: Insufficient documentation

## 2018-07-26 DIAGNOSIS — Z881 Allergy status to other antibiotic agents status: Secondary | ICD-10-CM | POA: Insufficient documentation

## 2018-07-26 DIAGNOSIS — N186 End stage renal disease: Secondary | ICD-10-CM | POA: Insufficient documentation

## 2018-07-26 DIAGNOSIS — Z7982 Long term (current) use of aspirin: Secondary | ICD-10-CM | POA: Insufficient documentation

## 2018-07-26 DIAGNOSIS — I120 Hypertensive chronic kidney disease with stage 5 chronic kidney disease or end stage renal disease: Secondary | ICD-10-CM | POA: Insufficient documentation

## 2018-07-26 DIAGNOSIS — E7800 Pure hypercholesterolemia, unspecified: Secondary | ICD-10-CM | POA: Insufficient documentation

## 2018-07-26 DIAGNOSIS — Z79899 Other long term (current) drug therapy: Secondary | ICD-10-CM | POA: Insufficient documentation

## 2018-07-26 DIAGNOSIS — K219 Gastro-esophageal reflux disease without esophagitis: Secondary | ICD-10-CM | POA: Insufficient documentation

## 2018-07-26 DIAGNOSIS — Y841 Kidney dialysis as the cause of abnormal reaction of the patient, or of later complication, without mention of misadventure at the time of the procedure: Secondary | ICD-10-CM | POA: Insufficient documentation

## 2018-07-26 HISTORY — PX: IR US GUIDE VASC ACCESS LEFT: IMG2389

## 2018-07-26 HISTORY — PX: IR AV DIALY SHUNT INTRO NEEDLE/INTRACATH INITIAL W/PTA/IMG LEFT: IMG6103

## 2018-07-26 LAB — BASIC METABOLIC PANEL
Anion gap: 14 (ref 5–15)
BUN: 53 mg/dL — ABNORMAL HIGH (ref 6–20)
CO2: 30 mmol/L (ref 22–32)
Calcium: 9.9 mg/dL (ref 8.9–10.3)
Chloride: 90 mmol/L — ABNORMAL LOW (ref 98–111)
Creatinine, Ser: 10.43 mg/dL — ABNORMAL HIGH (ref 0.61–1.24)
GFR calc Af Amer: 6 mL/min — ABNORMAL LOW (ref >60)
GFR calc non Af Amer: 5 mL/min — ABNORMAL LOW (ref >60)
Glucose, Bld: 104 mg/dL — ABNORMAL HIGH (ref 70–99)
Potassium: 5.3 mmol/L — ABNORMAL HIGH (ref 3.5–5.1)
Sodium: 134 mmol/L — ABNORMAL LOW (ref 135–145)

## 2018-07-26 LAB — PROTIME-INR
INR: 1 (ref 0.8–1.2)
Prothrombin Time: 13.1 seconds (ref 11.4–15.2)

## 2018-07-26 LAB — CBC
HCT: 38.7 % — ABNORMAL LOW (ref 39.0–52.0)
Hemoglobin: 12.6 g/dL — ABNORMAL LOW (ref 13.0–17.0)
MCH: 29.8 pg (ref 26.0–34.0)
MCHC: 32.6 g/dL (ref 30.0–36.0)
MCV: 91.5 fL (ref 80.0–100.0)
Platelets: 153 10*3/uL (ref 150–400)
RBC: 4.23 MIL/uL (ref 4.22–5.81)
RDW: 13.9 % (ref 11.5–15.5)
WBC: 6.8 10*3/uL (ref 4.0–10.5)
nRBC: 0 % (ref 0.0–0.2)

## 2018-07-26 MED ORDER — FENTANYL CITRATE (PF) 100 MCG/2ML IJ SOLN
INTRAMUSCULAR | Status: AC | PRN
  Administered 2018-07-26 (×2): 25 ug via INTRAVENOUS

## 2018-07-26 MED ORDER — LIDOCAINE HCL 1 % IJ SOLN
INTRAMUSCULAR | Status: AC
  Filled 2018-07-26: qty 20

## 2018-07-26 MED ORDER — FENTANYL CITRATE (PF) 100 MCG/2ML IJ SOLN
INTRAMUSCULAR | Status: AC
  Filled 2018-07-26: qty 2

## 2018-07-26 MED ORDER — MIDAZOLAM HCL 2 MG/2ML IJ SOLN
INTRAMUSCULAR | Status: AC | PRN
  Administered 2018-07-26: 1 mg via INTRAVENOUS
  Administered 2018-07-26: 0.5 mg via INTRAVENOUS

## 2018-07-26 MED ORDER — MIDAZOLAM HCL 2 MG/2ML IJ SOLN
INTRAMUSCULAR | Status: AC
  Filled 2018-07-26: qty 2

## 2018-07-26 MED ORDER — IOHEXOL 300 MG/ML  SOLN
100.0000 mL | Freq: Once | INTRAMUSCULAR | Status: AC | PRN
  Administered 2018-07-26: 60 mL via INTRAVENOUS

## 2018-07-26 MED ORDER — SODIUM CHLORIDE 0.9 % IV SOLN: INTRAVENOUS | Status: DC

## 2018-07-26 NOTE — Discharge Instructions (Signed)
Fistulografa de dilisis, cuidados posteriores Dialysis Fistulogram, Care After Esta hoja le proporciona informacin sobre cmo cuidarse despus del procedimiento. Su mdico tambin podr darle instrucciones ms especficas. Comunquese con su mdico si tiene problemas o preguntas. Qu puedo esperar despus del procedimiento? Despus del procedimiento, es comn [employer] siguientes sntomas:  Una pequea molestia en la zona en la que se coloc el tubo delgado y pequeo (catter) para el procedimiento.  Un pequeo hematoma alrededor de la fstula.  Somnolencia y cansancio Company secretary). Siga estas indicaciones en su casa: Actividad   Haga reposo en su casa y no levante objetos que pesen ms de 5 lb (2,3 kg) el da despus del procedimiento.  Reanude sus actividades normales segn lo indicado por el mdico. Pregntele al mdico qu actividades son seguras para usted.  No conduzca ni use maquinaria pesada mientras toma analgsicos recetados.  No conduzca durante 24horas si recibi un medicamento para ayudarlo a relajarse (sedante) durante el procedimiento. Medicamentos   [employer] de venta libre y los recetados solamente como se lo haya indicado el mdico. Cuidado del Environmental consultant de la puncin  Siga las indicaciones de su mdico acerca de cmo Government social research officer donde se insertaron los catteres. Asegrese de hacer lo siguiente: ? Lvese las manos con agua y jabn antes de Quarry manager las vendas (vendaje). Use desinfectante para manos si no dispone de [place] y [place]. ? Cambie el vendaje como se lo haya indicado el mdico. ? No retire los puntos (suturas), la goma para cerrar la piel o las tiras [place]. Es posible que estos cierres cutneos Animal nutritionist en la piel durante 2semanas o ms. Si los bordes de las tiras adhesivas empiezan a despegarse y Therapist, sports, puede recortar los que estn sueltos. No retire las tiras [facility] por completo a menos que el mdico se lo indique.  Controle  la zona de la puncin todos los das para detectar signos de infeccin. Est atento a los siguientes signos: ? Dolor, hinchazn o enrojecimiento. ? Lquido o sangre. ? Calor. ? Pus o mal olor. Instrucciones generales  No tome baos de inmersin, no nade ni use el jacuzzi hasta que el mdico lo autorice. Pregntele al mdico si puede ducharse. Thurston Pounds solo le permitan darse baos de [place].  Controle atentamente la fstula de dilisis. Verifique para asegurarse de que puede sentir una vibracin o un zumbido (frmito) cuando Risk manager los dedos sobre la fstula.  Evite daar el injerto o la fstula: ? No use ropa ajustada ni joyas en el brazo o la pierna que tiene el injerto o la fstula. ? Informe a todos sus mdicos que tiene un injerto o una fstula para dilisis. ? No permita extracciones de sangre, terapias intravenosas ni lecturas de la presin arterial en el brazo que tiene la fstula o el injerto. ? No permita que le apliquen vacunas antigripales ni otras vacunas en el brazo con la fstula o el injerto.  Concurra a todas las visitas de seguimiento como se lo haya indicado el mdico. Esto es importante. Comunquese con un mdico si:  Tiene enrojecimiento, hinchazn o dolor en el lugar donde se insert el catter.  Observa lquido o sangre que proviene del lugar de la insercin del catter.  El lugar de la insercin del catter est caliente al tacto.  Tiene pus o percibe mal olor que proviene del lugar de la insercin del catter.  Tiene fiebre o siente escalofros. Solicite ayuda de inmediato si:  Se siente dbil.  Tiene problemas de equilibrio.  Tiene dificultad para mover los brazos o las piernas.  Tiene problemas visuales o para hablar.  Ya no puede sentir una vibracin o un zumbido cuando [employer] dedos sobre la fstula de dilisis.  La extremidad que se [place] para el procedimiento: ? Se hincha. ? Duele. ? Est fra. ? Cambia de color, por ejemplo, se torna  azulada o blanco plido.  Siente falta de aire o Tourist information centre manager. Resumen  Despus de [place] fistulografa de dilisis, es comn tener una pequea molestia o algunos moretones en la zona donde se coloc el tubo delgado y pequeo (catter).  Descanse en su [place] despus del procedimiento. Reanude sus actividades normales segn lo indicado por el mdico.  Delphi de venta libre y los recetados solamente como se lo haya indicado el mdico.  Siga las indicaciones de su mdico acerca de cmo Government social research officer donde se insert el catter.  Concurra a todas las visitas de seguimiento como se lo haya indicado el mdico. Esta informacin no tiene Marine scientist el consejo del mdico. Asegrese de hacerle al mdico cualquier pregunta que tenga. Document Released: 05/29/2013 Document Revised: 03/17/2017 Document Reviewed: 03/17/2017 Elsevier Patient Education  2020 [employer].

## 2018-07-26 NOTE — H&P (Signed)
Chief Complaint: Patient was seen in consultation today for left arm dialysis fistula evaluation and possible intervention at the request of Cam[facility] Tass[facility]  Referring Physician(s): Barry  Supervising Physician: Corrie Mckusick  Patient Status: [facility] - Out-pt  History of Present Illness: Cole [name] is a 48 y.o. male   Left arm dialysis fistula placed 2013 Using well Last use yesterday-- completed dialysis But, slow flow Has noticed slow for few weeks-- worsening Now scheduled for fistulogram with possible intervention  Fistulogram 2016-- no stenosis per evaluation   Past Medical History:  Diagnosis Date  . Chronic kidney disease   . Constipation   . GERD (gastroesophageal reflux disease)   . Hypercholesteremia   . Hypertension   . Renal failure    on dialysis M/W/F    Past Surgical History:  Procedure Laterality Date  . AV FISTULA PLACEMENT  10/05/2011   Procedure: ARTERIOVENOUS (AV) FISTULA CREATION;  Surgeon: Rosetta Posner, MD;  Location: [place];  Service: Vascular;  Laterality: Left;  . CARDIAC CATHETERIZATION N/A 10/23/2014   Procedure: Right/Left Heart Cath and Coronary Angiography;  Surgeon: Belva Crome, MD;  Location: [place] [facility];  Service: Cardiovascular;  Laterality: N/A;  . INSERTION OF DIALYSIS CATHETER  10/05/2011   Procedure: INSERTION OF DIALYSIS CATHETER;  Surgeon: Rosetta Posner, MD;  Location: [place];  Service: Vascular;  Laterality: N/A;  right internal jugular vein  . IR DIALY SHUNT INTRO NEEDLE/INTRACATH INITIAL W/IMG LEFT Left 11/25/2017  . NO PAST SURGERIES    . REVISON OF ARTERIOVENOUS FISTULA Left 0/08/6576   Procedure: PLICATION OF LEFT RADIOCEPHALIC ARTERIOVENOUS FISTULA -PSEUDOANEURYSM TIMES TWO;  Surgeon: Conrad Milo, MD;  Location: [place];  Service: Vascular;  Laterality: Left;    Allergies: Bactrim [sulfamethoxazole-trimethoprim]  Medications: Prior to Admission medications   Medication Sig Start Date End Date  Taking? Authorizing Provider  allopurinol (ZYLOPRIM) 100 MG tablet Take 200 mg by mouth daily.   Yes [provider]  aspirin 81 MG tablet Take 81 mg by mouth at bedtime.    Yes [provider]  calcium elemental as carbonate (TUMS ULTRA 1000) 400 MG tablet Chew 800 mg by mouth 3 (three) times daily with meals.    Yes [provider]  carvedilol (COREG) 6.25 MG tablet Take 1 tablet (6.25 mg total) by mouth as directed. As needed- for breakthrough afib Patient taking differently: Take 6.25 mg by mouth 2 (two) times a day.  01/21/18  Yes Sherran Needs, NP  Cholecalciferol (VITAMIN D) 50 MCG (2000 UT) tablet Take 4,000 Units by mouth every Monday, Wednesday, and Friday with hemodialysis.   Yes [provider]  clonazePAM (KLONOPIN) 2 MG tablet Take 2 mg by mouth daily as needed (cramps).    Yes [provider]  multivitamin (RENA-VIT) TABS tablet Take 1 tablet by mouth at bedtime. 10/10/11  Yes Zeyfang, Shanon Brow, PA-C  simvastatin (ZOCOR) 20 MG tablet Take 20 mg by mouth at bedtime.    Yes [provider]     Family History  Problem Relation Age of Onset  . Hypertension Mother   . Lung cancer Father     Social History   Socioeconomic History  . Marital status: Single    Spouse name: Not on file  . Number of children: Not on file  . Years of education: Not on file  . Highest education level: Not on file  Occupational History  . Not on file  Social Needs  . Financial resource strain:  Not on file  . Food insecurity    Worry: Not on file    Inability: Not on file  . Transportation needs    Medical: Not on file    Non-medical: Not on file  Tobacco Use  . Smoking status: Never Smoker  . Smokeless tobacco: Never Used  Substance and Sexual Activity  . Alcohol use: No    Alcohol/week: 0.0 standard drinks  . Drug use: No  . Sexual activity: Yes    Birth control/protection: None  Lifestyle  . Physical activity    Days per week: Not  on file    Minutes per session: Not on file  . Stress: Not on file  Relationships  . Social Herbalist on phone: Not on file    Gets together: Not on file    Attends religious service: Not on file    Active member of club or organization: Not on file    Attends meetings of clubs or organizations: Not on file    Relationship status: Not on file  Other Topics Concern  . Not on file  Social History Narrative  . Not on file    Review of Systems: A 12 point ROS discussed and pertinent positives are indicated in the HPI above.  All other systems are negative.  Review of Systems  Constitutional: Negative for activity change, appetite change and fatigue.  Respiratory: Negative for cough and shortness of breath.   Cardiovascular: Negative for chest pain.  Gastrointestinal: Negative for abdominal pain.  Musculoskeletal: Negative for back pain.  Neurological: Negative for weakness.  Psychiatric/Behavioral: Negative for behavioral problems and confusion.    Vital Signs: BP 110/78   Pulse 69   Temp 98 F (36.7 C)   Ht 5\' 4"  (1.626 m)   Wt 141 lb 1.5 oz (64 kg)   SpO2 98%   BMI 24.22 kg/m   Physical Exam Vitals signs reviewed.  Cardiovascular:     Rate and Rhythm: Normal rate and regular rhythm.     Heart sounds: Normal heart sounds.  Pulmonary:     Effort: Pulmonary effort is normal.     Breath sounds: Normal breath sounds.  Abdominal:     Tenderness: There is no abdominal tenderness.  Musculoskeletal: Normal range of motion.     Comments: Left arm dialysis fistula: +pulse; no thrill  Skin:    General: Skin is warm and dry.  Neurological:     Mental Status: He is alert and oriented to person, place, and time.  Psychiatric:        Mood and Affect: Mood normal.        Behavior: Behavior normal.        Thought Content: Thought content normal.        Judgment: Judgment normal.     Comments: Interpreter Grecia Nandin     Imaging: No results found.  Labs:   CBC: Recent Labs    01/10/18 1916  WBC 5.4  HGB 10.9*  HCT 34.0*  PLT 146*    COAGS: No results for input(s): INR, APTT in the last 8760 hours.  BMP: Recent Labs    01/10/18 1916  NA 134*  K 3.6  CL 88*  CO2 29  GLUCOSE 157*  BUN 32*  CALCIUM 8.0*  CREATININE 6.94*  GFRNONAA 9*  GFRAA 10*    LIVER FUNCTION TESTS: No results for input(s): BILITOT, AST, ALT, ALKPHOS, PROT, ALBUMIN in the last 8760 hours.  TUMOR MARKERS: No results for  input(s): AFPTM, CEA, CA199, CHROMGRNA in the last 8760 hours.  Assessment and Plan:  ESRD Slow flow of left arm fistula for few weeks Scheduled for fistulogram with possible intervention Pt is aware of procedure benefits and risks- including but not limited to Infection; bleeding; damage to structures Agreeable to proceed Consent signed in chart  Thank you for this interesting consult.  I greatly enjoyed meeting Jaylin Ochoa-Gonzalez and look forward to participating in their care.  A copy of this report was sent to the requesting provider on this date.  Electronically Signed: Lavonia Drafts, PA-C 07/26/2018, 11:42 AM   I spent a total of  30 Minutes   in face to face in clinical consultation, greater than 50% of which was counseling/coordinating care for left arm fistula evaluation/poss intervention

## 2018-07-26 NOTE — Procedures (Signed)
Interventional Radiology Procedure Note  Procedure:  LUE fistulagram, with PTA of high grade stenosis of the outflow, <30% residual.   Findings:  Improved thrill at the radial AV connection (Cam[place] fistula) after PTA.  Marland Kitchen  Complications: None  Recommendations:  - Ok to use - Do not submerge for 7 days - Routine care - may remove stay suture in 24-48 hours at dialysis - 1 hr dc home   Signed,  Dulcy Fanny. Earleen Newport, DO

## 2018-09-17 ENCOUNTER — Encounter (HOSPITAL_COMMUNITY): Payer: Self-pay | Admitting: *Deleted

## 2018-09-17 ENCOUNTER — Other Ambulatory Visit: Payer: Self-pay

## 2018-09-17 ENCOUNTER — Emergency Department (HOSPITAL_COMMUNITY)
Admission: EM | Admit: 2018-09-17 | Discharge: 2018-09-17 | Disposition: A | Discharge status: Home / Self Care | Payer: Self-pay | Attending: Emergency Medicine | Admitting: Emergency Medicine

## 2018-09-17 DIAGNOSIS — Z992 Dependence on renal dialysis: Secondary | ICD-10-CM | POA: Insufficient documentation

## 2018-09-17 DIAGNOSIS — N186 End stage renal disease: Secondary | ICD-10-CM | POA: Insufficient documentation

## 2018-09-17 DIAGNOSIS — R197 Diarrhea, unspecified: Secondary | ICD-10-CM | POA: Insufficient documentation

## 2018-09-17 DIAGNOSIS — Z7982 Long term (current) use of aspirin: Secondary | ICD-10-CM | POA: Insufficient documentation

## 2018-09-17 DIAGNOSIS — I5022 Chronic systolic (congestive) heart failure: Secondary | ICD-10-CM | POA: Insufficient documentation

## 2018-09-17 DIAGNOSIS — Z79899 Other long term (current) drug therapy: Secondary | ICD-10-CM | POA: Insufficient documentation

## 2018-09-17 DIAGNOSIS — I132 Hypertensive heart and chronic kidney disease with heart failure and with stage 5 chronic kidney disease, or end stage renal disease: Secondary | ICD-10-CM | POA: Insufficient documentation

## 2018-09-17 LAB — COMPREHENSIVE METABOLIC PANEL
ALT: 13 U/L (ref 0–44)
AST: 14 U/L — ABNORMAL LOW (ref 15–41)
Albumin: 3.9 g/dL (ref 3.5–5.0)
Alkaline Phosphatase: 78 U/L (ref 38–126)
Anion gap: 16 — ABNORMAL HIGH (ref 5–15)
BUN: 59 mg/dL — ABNORMAL HIGH (ref 6–20)
CO2: 28 mmol/L (ref 22–32)
Calcium: 9.5 mg/dL (ref 8.9–10.3)
Chloride: 95 mmol/L — ABNORMAL LOW (ref 98–111)
Creatinine, Ser: 9.87 mg/dL — ABNORMAL HIGH (ref 0.61–1.24)
GFR calc Af Amer: 6 mL/min — ABNORMAL LOW (ref >60)
GFR calc non Af Amer: 6 mL/min — ABNORMAL LOW (ref >60)
Glucose, Bld: 103 mg/dL — ABNORMAL HIGH (ref 70–99)
Potassium: 4.5 mmol/L (ref 3.5–5.1)
Sodium: 139 mmol/L (ref 135–145)
Total Bilirubin: 0.7 mg/dL (ref 0.3–1.2)
Total Protein: 8 g/dL (ref 6.5–8.1)

## 2018-09-17 LAB — CBC
HCT: 36.7 % — ABNORMAL LOW (ref 39.0–52.0)
Hemoglobin: 11.5 g/dL — ABNORMAL LOW (ref 13.0–17.0)
MCH: 29.6 pg (ref 26.0–34.0)
MCHC: 31.3 g/dL (ref 30.0–36.0)
MCV: 94.3 fL (ref 80.0–100.0)
Platelets: 138 10*3/uL — ABNORMAL LOW (ref 150–400)
RBC: 3.89 MIL/uL — ABNORMAL LOW (ref 4.22–5.81)
RDW: 13.3 % (ref 11.5–15.5)
WBC: 9.1 10*3/uL (ref 4.0–10.5)
nRBC: 0 % (ref 0.0–0.2)

## 2018-09-17 LAB — LIPASE, BLOOD: Lipase: 49 U/L (ref 11–51)

## 2018-09-17 MED ORDER — SODIUM CHLORIDE 0.9% FLUSH
3.0000 mL | Freq: Once | INTRAVENOUS | Status: AC
  Administered 2018-09-17: 3 mL via INTRAVENOUS

## 2018-09-17 NOTE — ED Provider Notes (Signed)
[facility] DEPT Provider Note   CSN: [id number] Arrival date & time: 09/17/18  1043     History   Chief Complaint Chief Complaint  Patient presents with  . Diarrhea    HPI Cole [name] is a 48 y.o. male.  Presents the emergency department with a chief complaint of diarrhea.  ESRD on dialysis Monday Wednesday Friday.  States symptoms started after dialysis on Wednesday.  Had had multiple loose stools daily since then.  Yesterday 4 episodes, today 3 episodes.  Episodes seem to occur mostly in the morning and improves throughout the day.  Has brief abdominal discomfort associated with the bowel movements, but no ongoing abdominal pain.  He has no nausea or vomiting.  No blood in stools.  States he has had had poor appetite since having the symptoms.  Patient states that his kidney doctor recommended trial of Imodium which she has been taking since Thursday and yesterday he was prescribed Flagyl and loperamide which he started taking yesterday, has not taken today yet.  Language line interpreter used for entirety of history, physical and discussion of results and plan.      HPI  Past Medical History:  Diagnosis Date  . Chronic kidney disease   . Constipation   . GERD (gastroesophageal reflux disease)   . Hypercholesteremia   . Hypertension   . Renal failure    on dialysis M/W/F    Patient Active Problem List   Diagnosis Date Noted  . Right arm pain 05/13/2017  . Pruritus 05/13/2017  . Systolic congestive heart failure ([place]) 10/16/2014  . Essential hypertension 09/21/2014  . Dyslipidemia 09/21/2014  . End stage renal disease ([place]) 11/17/2011    Past Surgical History:  Procedure Laterality Date  . AV FISTULA PLACEMENT  10/05/2011   Procedure: ARTERIOVENOUS (AV) FISTULA CREATION;  Surgeon: Rosetta Posner, MD;  Location: [facility];  Service: Vascular;  Laterality: Left;  . CARDIAC CATHETERIZATION N/A 10/23/2014   Procedure: Right/Left Heart  Cath and Coronary Angiography;  Surgeon: Belva Crome, MD;  Location: [facility];  Service: Cardiovascular;  Laterality: N/A;  . INSERTION OF DIALYSIS CATHETER  10/05/2011   Procedure: INSERTION OF DIALYSIS CATHETER;  Surgeon: Rosetta Posner, MD;  Location: [facility];  Service: Vascular;  Laterality: N/A;  right internal jugular vein  . IR AV DIALY SHUNT INTRO NEEDLE/INTRACATH INITIAL W/PTA/IMG LEFT  07/26/2018  . IR DIALY SHUNT INTRO NEEDLE/INTRACATH INITIAL W/IMG LEFT Left 11/25/2017  . IR US GUIDE VASC ACCESS LEFT  07/26/2018  . NO PAST SURGERIES    . REVISON OF ARTERIOVENOUS FISTULA Left [id number]   Procedure: PLICATION OF LEFT RADIOCEPHALIC ARTERIOVENOUS FISTULA -PSEUDOANEURYSM TIMES TWO;  Surgeon: Conrad Delshire, MD;  Location: [facility];  Service: Vascular;  Laterality: Left;        Home Medications    Prior to Admission medications   Medication Sig Start Date End Date Taking? Authorizing Provider  allopurinol (ZYLOPRIM) 100 MG tablet Take 200 mg by mouth daily.    [provider]  aspirin 81 MG tablet Take 81 mg by mouth at bedtime.     [provider]  calcium elemental as carbonate (TUMS ULTRA 1000) 400 MG tablet Chew 800 mg by mouth 3 (three) times daily with meals.     [provider]  carvedilol (COREG) 6.25 MG tablet Take 1 tablet (6.25 mg total) by mouth as directed. As needed- for breakthrough afib Patient taking differently: Take 6.25 mg by mouth 2 (  two) times a day.  01/21/18   Sherran Needs, NP  Cholecalciferol (VITAMIN D) 50 MCG (2000 UT) tablet Take 4,000 Units by mouth every Monday, Wednesday, and Friday with hemodialysis.    [provider]  clonazePAM (KLONOPIN) 2 MG tablet Take 2 mg by mouth daily as needed (cramps).     [provider]  multivitamin (RENA-VIT) TABS tablet Take 1 tablet by mouth at bedtime. 10/10/11   Ernest Haber, PA-C  simvastatin (ZOCOR) 20 MG tablet Take 20 mg by mouth at bedtime.     [provider]    Family History Family History  Problem Relation Age of Onset  . Hypertension Mother   . Lung cancer Father     Social History Social History   Tobacco Use  . Smoking status: Never Smoker  . Smokeless tobacco: Never Used  Substance Use Topics  . Alcohol use: No    Alcohol/week: 0.0 standard drinks  . Drug use: No     Allergies   Bactrim [sulfamethoxazole-trimethoprim]   Review of Systems Review of Systems  Constitutional: Negative for chills and fever.  HENT: Negative for ear pain and sore throat.   Eyes: Negative for pain and visual disturbance.  Respiratory: Negative for cough and shortness of breath.   Cardiovascular: Negative for chest pain and palpitations.  Gastrointestinal: Positive for diarrhea. Negative for abdominal pain and vomiting.  Genitourinary: Negative for dysuria and hematuria.  Musculoskeletal: Negative for arthralgias and back pain.  Skin: Negative for color change and rash.  Neurological: Negative for seizures and syncope.  All other systems reviewed and are negative.    Physical Exam Updated Vital Signs BP 93/71 (BP Location: Right Arm)   Pulse 75   Temp 98.6 F (37 C) (Oral)   SpO2 100%   Physical Exam   ED Treatments / Results  Labs (all labs ordered are listed, but only abnormal results are displayed) Labs Reviewed  LIPASE, BLOOD  COMPREHENSIVE METABOLIC PANEL  CBC  URINALYSIS, ROUTINE W REFLEX MICROSCOPIC    EKG None  Radiology No results found.  Procedures Procedures (including critical care time)  Medications Ordered in ED Medications  sodium chloride flush (NS) 0.9 % injection 3 mL (has no administration in time range)     Initial Impression / Assessment and Plan / ED Course  I have reviewed the triage vital signs and the nursing notes.  Pertinent labs & imaging results that were available during my care of the patient were reviewed by me and considered in my medical decision making (see  chart for details).  Clinical Course as of Sep 17 1330  Sat Sep 17, 2018  1253 Completed initial assessment, utilized Art therapist   [RD]  1331 Reassessed patient, discussed results, return precautions and discharge instructions, language line used   [RD]    Clinical Course User Index [RD] Lucrezia Starch, MD      49 year old gentleman with past medical history ESRD on dialysis presented with new onset diarrhea.  Here patient is well-appearing, no further episodes of diarrhea, electrolytes stable, no leukocytosis.  Given frequency of 3-4 BMs, no prior recent antibiotic use, no leukocytosis, no abdominal pain, low suspicion for C. difficile.  Given soft abdomen on serial exam, low suspicion for acute abdominal process.  Suspect gastroenteritis versus side effect of dialysis.  Patient just started Flagyl yesterday by his nephrologist, feel this is reasonable choice and recommended patient continue taking this antibiotic.  I reviewed return precautions as well as  recommendation for close recheck on Monday or Tuesday with his primary doctor or nephrologist.  After the discussed management above, the patient was determined to be safe for discharge.  The patient was in agreement with this plan and all questions regarding their care were answered.  ED return precautions were discussed and the patient will return to the ED with any significant worsening of condition.   Final Clinical Impressions(s) / ED Diagnoses   Final diagnoses:  Diarrhea, unspecified type    ED Discharge Orders    None       Lucrezia Starch, MD 09/17/18 1334

## 2018-09-17 NOTE — Discharge Instructions (Signed)
Please call both your primary doctor as well as your nephrologist to schedule follow-up appointment for recheck early next week.  If you develop abdominal pain, blood in stool, increase in stool frequency despite current treatment plan, please return to ER for reassessment.  Please continue the previously prescribed medications as directed.  Please continue with your dialysis schedule as normal.

## 2018-09-17 NOTE — ED Triage Notes (Signed)
Cole [name]  [phone]  Pt reports diarrhea x 4. Tried to educate that [facility] offers services related to Diarrhea which can go along with Dialysis, Pt admittedly states "this is not related to my dialysis" Pt denies any other complaints.

## 2018-11-16 ENCOUNTER — Other Ambulatory Visit (HOSPITAL_COMMUNITY): Payer: Self-pay | Admitting: Nephrology

## 2018-11-16 DIAGNOSIS — N186 End stage renal disease: Secondary | ICD-10-CM

## 2018-11-21 ENCOUNTER — Other Ambulatory Visit: Payer: Self-pay | Admitting: Radiology

## 2018-11-22 ENCOUNTER — Other Ambulatory Visit: Payer: Self-pay

## 2018-11-22 ENCOUNTER — Ambulatory Visit (HOSPITAL_COMMUNITY)
Admission: RE | Admit: 2018-11-22 | Discharge: 2018-11-22 | Disposition: A | Discharge status: Home / Self Care | Payer: Self-pay | Source: Ambulatory Visit | Attending: Nephrology | Admitting: Nephrology

## 2018-11-22 ENCOUNTER — Encounter (HOSPITAL_COMMUNITY): Payer: Self-pay

## 2018-11-22 DIAGNOSIS — I120 Hypertensive chronic kidney disease with stage 5 chronic kidney disease or end stage renal disease: Secondary | ICD-10-CM | POA: Insufficient documentation

## 2018-11-22 DIAGNOSIS — E7800 Pure hypercholesterolemia, unspecified: Secondary | ICD-10-CM | POA: Insufficient documentation

## 2018-11-22 DIAGNOSIS — Z7982 Long term (current) use of aspirin: Secondary | ICD-10-CM | POA: Insufficient documentation

## 2018-11-22 DIAGNOSIS — Z992 Dependence on renal dialysis: Secondary | ICD-10-CM | POA: Insufficient documentation

## 2018-11-22 DIAGNOSIS — N186 End stage renal disease: Secondary | ICD-10-CM | POA: Insufficient documentation

## 2018-11-22 DIAGNOSIS — K219 Gastro-esophageal reflux disease without esophagitis: Secondary | ICD-10-CM | POA: Insufficient documentation

## 2018-11-22 DIAGNOSIS — Z79899 Other long term (current) drug therapy: Secondary | ICD-10-CM | POA: Insufficient documentation

## 2018-11-22 HISTORY — PX: IR DIALY SHUNT INTRO NEEDLE/INTRACATH INITIAL W/IMG LEFT: IMG6102

## 2018-11-22 LAB — CBC
HCT: 37.5 % — ABNORMAL LOW (ref 39.0–52.0)
Hemoglobin: 12.2 g/dL — ABNORMAL LOW (ref 13.0–17.0)
MCH: 29.6 pg (ref 26.0–34.0)
MCHC: 32.5 g/dL (ref 30.0–36.0)
MCV: 91 fL (ref 80.0–100.0)
Platelets: 125 10*3/uL — ABNORMAL LOW (ref 150–400)
RBC: 4.12 MIL/uL — ABNORMAL LOW (ref 4.22–5.81)
RDW: 13.2 % (ref 11.5–15.5)
WBC: 6.7 10*3/uL (ref 4.0–10.5)
nRBC: 0 % (ref 0.0–0.2)

## 2018-11-22 LAB — BASIC METABOLIC PANEL
Anion gap: 17 — ABNORMAL HIGH (ref 5–15)
BUN: 56 mg/dL — ABNORMAL HIGH (ref 6–20)
CO2: 29 mmol/L (ref 22–32)
Calcium: 9.4 mg/dL (ref 8.9–10.3)
Chloride: 92 mmol/L — ABNORMAL LOW (ref 98–111)
Creatinine, Ser: 10.97 mg/dL — ABNORMAL HIGH (ref 0.61–1.24)
GFR calc Af Amer: 6 mL/min — ABNORMAL LOW (ref >60)
GFR calc non Af Amer: 5 mL/min — ABNORMAL LOW (ref >60)
Glucose, Bld: 99 mg/dL (ref 70–99)
Potassium: 4.7 mmol/L (ref 3.5–5.1)
Sodium: 138 mmol/L (ref 135–145)

## 2018-11-22 LAB — PROTIME-INR
INR: 1 (ref 0.8–1.2)
Prothrombin Time: 13.1 seconds (ref 11.4–15.2)

## 2018-11-22 MED ORDER — SODIUM CHLORIDE 0.9 % IV SOLN: INTRAVENOUS | Status: DC

## 2018-11-22 MED ORDER — FENTANYL CITRATE (PF) 100 MCG/2ML IJ SOLN
INTRAMUSCULAR | Status: AC
  Filled 2018-11-22: qty 2

## 2018-11-22 MED ORDER — MIDAZOLAM HCL 2 MG/2ML IJ SOLN
INTRAMUSCULAR | Status: AC
  Filled 2018-11-22: qty 2

## 2018-11-22 MED ORDER — IOHEXOL 300 MG/ML  SOLN
100.0000 mL | Freq: Once | INTRAMUSCULAR | Status: AC | PRN
  Administered 2018-11-22: 55 mL via INTRAVENOUS

## 2018-11-22 NOTE — H&P (Signed)
Chief Complaint: Patient was seen in consultation today for left arm dialysis fistula evaluation at the request of [facility]  Referring Physician(s): [place]  Supervising Physician: Aletta Edouard  Patient Status: [facility] - Out-pt  History of Present Illness: Cole [name] is a 48 y.o. male   ESRD Left arm fistula running slow per MD  Completed dialysis yesterday Plan for dialysis tomorrow  Last intervention: 07/26/18: IMPRESSION: Status post left upper extremity fistulagram and treatment of high-grade outflow stenoses with serial 5 mm, 7 mm, 9 mm balloon angioplasty with resolution and less than 30% stenosis at each site upon completion. Excellent thrill was palpated, improved from the baseline at the AV connection in the radial region.  Scheduled for fistulogram with possible angioplasty/stent placement  Past Medical History:  Diagnosis Date  . Chronic kidney disease   . Constipation   . GERD (gastroesophageal reflux disease)   . Hypercholesteremia   . Hypertension   . Renal failure    on dialysis M/W/F    Past Surgical History:  Procedure Laterality Date  . AV FISTULA PLACEMENT  10/05/2011   Procedure: ARTERIOVENOUS (AV) FISTULA CREATION;  Surgeon: Rosetta Posner, MD;  Location: [facility];  Service: Vascular;  Laterality: Left;  . CARDIAC CATHETERIZATION N/A 10/23/2014   Procedure: Right/Left Heart Cath and Coronary Angiography;  Surgeon: Belva Crome, MD;  Location: [facility];  Service: Cardiovascular;  Laterality: N/A;  . INSERTION OF DIALYSIS CATHETER  10/05/2011   Procedure: INSERTION OF DIALYSIS CATHETER;  Surgeon: Rosetta Posner, MD;  Location: [facility];  Service: Vascular;  Laterality: N/A;  right internal jugular vein  . IR AV DIALY SHUNT INTRO NEEDLE/INTRACATH INITIAL W/PTA/IMG LEFT  07/26/2018  . IR DIALY SHUNT INTRO NEEDLE/INTRACATH INITIAL W/IMG LEFT Left 11/25/2017  . IR US GUIDE VASC ACCESS LEFT  07/26/2018  . NO PAST SURGERIES     . REVISON OF ARTERIOVENOUS FISTULA Left [id number]   Procedure: PLICATION OF LEFT RADIOCEPHALIC ARTERIOVENOUS FISTULA -PSEUDOANEURYSM TIMES TWO;  Surgeon: Conrad Pomeroy, MD;  Location: [facility];  Service: Vascular;  Laterality: Left;    Allergies: Bactrim [sulfamethoxazole-trimethoprim]  Medications: Prior to Admission medications   Medication Sig Start Date End Date Taking? Authorizing Provider  allopurinol (ZYLOPRIM) 100 MG tablet Take 200 mg by mouth daily.   Yes [provider]  aspirin 81 MG tablet Take 81 mg by mouth at bedtime.    Yes [provider]  calcium elemental as carbonate (TUMS ULTRA 1000) 400 MG tablet Chew 800 mg by mouth 3 (three) times daily with meals.    Yes [provider]  carvedilol (COREG) 6.25 MG tablet Take 1 tablet (6.25 mg total) by mouth as directed. As needed- for breakthrough afib Patient taking differently: Take 6.25 mg by mouth 2 (two) times a day.  01/21/18  Yes Sherran Needs, NP  Cholecalciferol (VITAMIN D) 50 MCG (2000 UT) tablet Take 4,000 Units by mouth every Monday, Wednesday, and Friday with hemodialysis.   Yes [provider]  multivitamin (RENA-VIT) TABS tablet Take 1 tablet by mouth at bedtime. 10/10/11  Yes Zeyfang, Shanon Brow, PA-C  simvastatin (ZOCOR) 20 MG tablet Take 20 mg by mouth at bedtime.    Yes [provider]  clonazePAM (KLONOPIN) 2 MG tablet Take 2 mg by mouth daily as needed (cramps).     [provider]     Family History  Problem Relation Age of Onset  . Hypertension Mother   . Lung cancer Father  Social History   Socioeconomic History  . Marital status: Single    Spouse name: Not on file  . Number of children: Not on file  . Years of education: Not on file  . Highest education level: Not on file  Occupational History  . Not on file  Social Needs  . Financial resource strain: Not on file  . Food insecurity    Worry: Not on file    Inability: Not on file  .  Transportation needs    Medical: Not on file    Non-medical: Not on file  Tobacco Use  . Smoking status: Never Smoker  . Smokeless tobacco: Never Used  Substance and Sexual Activity  . Alcohol use: No    Alcohol/week: 0.0 standard drinks  . Drug use: No  . Sexual activity: Yes    Birth control/protection: None  Lifestyle  . Physical activity    Days per week: Not on file    Minutes per session: Not on file  . Stress: Not on file  Relationships  . Social Herbalist on phone: Not on file    Gets together: Not on file    Attends religious service: Not on file    Active member of club or organization: Not on file    Attends meetings of clubs or organizations: Not on file    Relationship status: Not on file  Other Topics Concern  . Not on file  Social History Narrative  . Not on file    Review of Systems: A 12 point ROS discussed and pertinent positives are indicated in the HPI above.  All other systems are negative.  Review of Systems  Constitutional: Negative for activity change, appetite change, fatigue and fever.  Respiratory: Negative for cough and shortness of breath.   Cardiovascular: Negative for chest pain.  Gastrointestinal: Negative for abdominal pain.  Musculoskeletal: Negative for back pain.  Neurological: Negative for weakness.  Psychiatric/Behavioral: Negative for behavioral problems and confusion.    Vital Signs: BP 127/87   Pulse 68   Temp (!) 97 F (36.1 C) (Skin)   Resp 18   Ht 5\' 5"  (1.651 m)   Wt 143 lb 4.8 oz (65 kg)   SpO2 100%   BMI 23.85 kg/m   Physical Exam Vitals signs reviewed.  Cardiovascular:     Rate and Rhythm: Normal rate and regular rhythm.     Heart sounds: Normal heart sounds.  Pulmonary:     Effort: Pulmonary effort is normal.     Breath sounds: Normal breath sounds.  Abdominal:     Palpations: Abdomen is soft.  Musculoskeletal: Normal range of motion.     Comments: +pulse +thrill Left arm fistual  Skin:     General: Skin is warm and dry.  Neurological:     Mental Status: He is alert and oriented to person, place, and time.  Psychiatric:        Mood and Affect: Mood normal.        Behavior: Behavior normal.        Thought Content: Thought content normal.        Judgment: Judgment normal.     Imaging: No results found.  Labs:  CBC: Recent Labs    01/10/18 1916 07/26/18 1210 09/17/18 1129  WBC 5.4 6.8 9.1  HGB 10.9* 12.6* 11.5*  HCT 34.0* 38.7* 36.7*  PLT 146* 153 138*    COAGS: Recent Labs    07/26/18 1210  INR 1.0  BMP: Recent Labs    01/10/18 1916 07/26/18 1210 09/17/18 1129  NA 134* 134* 139  K 3.6 5.3* 4.5  CL 88* 90* 95*  CO2 29 30 28   GLUCOSE 157* 104* 103*  BUN 32* 53* 59*  CALCIUM 8.0* 9.9 9.5  CREATININE 6.94* 10.43* 9.87*  GFRNONAA 9* 5* 6*  GFRAA 10* 6* 6*    LIVER FUNCTION TESTS: Recent Labs    09/17/18 1129  BILITOT 0.7  AST 14*  ALT 13  ALKPHOS 78  PROT 8.0  ALBUMIN 3.9    TUMOR MARKERS: No results for input(s): AFPTM, CEA, CA199, CHROMGRNA in the last 8760 hours.  Assessment and Plan:  ESRD Slow flow per MD For fistulogram/evaluation with possible angioplasty/stent placement Pt is aware of procedure benefits and risks including but not limited to' infection; bleeding; damage to structure Agreeable to proceed Consent signed in chart  Thank you for this interesting consult.  I greatly enjoyed meeting Oley Ochoa-Gonzalez and look forward to participating in their care.  A copy of this report was sent to the requesting provider on this date.  Electronically Signed: Lavonia Drafts, PA-C 11/22/2018, 1:15 PM   I spent a total of    25 Minutes in face to face in clinical consultation, greater than 50% of which was counseling/coordinating care for left arm dialysis fistula evaluation; possible intervention

## 2019-06-27 ENCOUNTER — Other Ambulatory Visit: Payer: Self-pay

## 2019-06-27 ENCOUNTER — Ambulatory Visit (INDEPENDENT_AMBULATORY_CARE_PROVIDER_SITE_OTHER): Payer: Self-pay | Admitting: Physician Assistant

## 2019-06-27 VITALS — BP 115/71 | HR 78 | Temp 97.4°F | Resp 18 | Ht 63.0 in | Wt 144.5 lb

## 2019-06-27 DIAGNOSIS — N186 End stage renal disease: Secondary | ICD-10-CM

## 2019-06-27 DIAGNOSIS — Z992 Dependence on renal dialysis: Secondary | ICD-10-CM

## 2019-06-27 NOTE — Progress Notes (Signed)
HISTORY AND PHYSICAL     CC:  dialysis access Requesting Provider:  Cleophas Dunker, *  HPI: This is a 49 y.o. male here for evaluation of his hemodialysis access.  He has hx of left radial cephalic AVF by Dr. Donnetta Hutching on 10/05/2011.   He was seen back in February 2017 by Dr. Bridgett Larsson for psa.  He was taken to the operating room for plication of left RC AVF 04/02/2015 by Dr. Bridgett Larsson.   Pt has hx of several fistulograms in the past.  In June 2020, he had treatment of an outflow stenosis with balloon agioplasty in IR.  Most recently in IR in October 2020, pt had diagnostic fistulogram that revealed radiocephalic fistula shows stable patency after prior balloon angioplasty with widely patent anastomosis between the radial artery and cephalic vein and no evidence of recurrent outflow stenoses between aneurysmal segments in the forearm. Venous outflow above the level of the antecubital fossa again demonstrates competing venous outflow between the cephalic vein and deep venous system. Central veins are normally patent.  He comes in today for evaluation of his fistula.  He states that he was sent for evaluation because the areas on his fistula are getting bigger.  He denies any issues with the machine, bleeding or pain.  He has an area on the aneurysm that is hypopigmented, but they are not sticking this area.  They are moving their stick sites around. He does not have pain in  His hand.   Pt is on dialysis.   Days of dialysis if applicable:  M/W/F     HD center if applicable:  [facility].   The pt is on a statin for cholesterol management.  The pt is on a daily aspirin.  Other AC:  none The pt is on BB for hypertension.  The pt is not diabetic.   Tobacco hx:  never  Past Medical History:  Diagnosis Date  . Chronic kidney disease   . Constipation   . GERD (gastroesophageal reflux disease)   . Hypercholesteremia   . Hypertension   . Renal failure    on dialysis M/W/F    Past  Surgical History:  Procedure Laterality Date  . AV FISTULA PLACEMENT  10/05/2011   Procedure: ARTERIOVENOUS (AV) FISTULA CREATION;  Surgeon: Rosetta Posner, MD;  Location: [facility];  Service: Vascular;  Laterality: Left;  . CARDIAC CATHETERIZATION N/A 10/23/2014   Procedure: Right/Left Heart Cath and Coronary Angiography;  Surgeon: Belva Crome, MD;  Location: [facility];  Service: Cardiovascular;  Laterality: N/A;  . INSERTION OF DIALYSIS CATHETER  10/05/2011   Procedure: INSERTION OF DIALYSIS CATHETER;  Surgeon: Rosetta Posner, MD;  Location: [place];  Service: Vascular;  Laterality: N/A;  right internal jugular vein  . IR AV DIALY SHUNT INTRO NEEDLE/INTRACATH INITIAL W/PTA/IMG LEFT  07/26/2018  . IR DIALY SHUNT INTRO NEEDLE/INTRACATH INITIAL W/IMG LEFT Left 11/25/2017  . IR DIALY SHUNT INTRO NEEDLE/INTRACATH INITIAL W/IMG LEFT Left 11/22/2018  . IR US GUIDE VASC ACCESS LEFT  07/26/2018  . NO PAST SURGERIES    . REVISON OF ARTERIOVENOUS FISTULA Left 08/26/1912   Procedure: PLICATION OF LEFT RADIOCEPHALIC ARTERIOVENOUS FISTULA -PSEUDOANEURYSM TIMES TWO;  Surgeon: Conrad Smithfield, MD;  Location: [facility];  Service: Vascular;  Laterality: Left;    Allergies  Allergen Reactions  . Bactrim [Sulfamethoxazole-Trimethoprim] Nausea And Vomiting    Current Outpatient Medications  Medication Sig Dispense Refill  . allopurinol (ZYLOPRIM) 100 MG tablet Take 200  mg by mouth daily.    Marland Kitchen aspirin 81 MG tablet Take 81 mg by mouth at bedtime.     . calcium elemental as carbonate (TUMS ULTRA 1000) 400 MG tablet Chew 800 mg by mouth 3 (three) times daily with meals.     . carvedilol (COREG) 6.25 MG tablet Take 1 tablet (6.25 mg total) by mouth as directed. As needed- for breakthrough afib (Patient taking differently: Take 6.25 mg by mouth 2 (two) times a day. ) 30 tablet 0  . Cholecalciferol (VITAMIN D) 50 MCG (2000 UT) tablet Take 4,000 Units by mouth every Monday, Wednesday, and Friday with hemodialysis.    Marland Kitchen  clonazePAM (KLONOPIN) 2 MG tablet Take 2 mg by mouth daily as needed (cramps).     . multivitamin (RENA-VIT) TABS tablet Take 1 tablet by mouth at bedtime.    . simvastatin (ZOCOR) 20 MG tablet Take 20 mg by mouth at bedtime.      No current facility-administered medications for this visit.    Family History  Problem Relation Age of Onset  . Hypertension Mother   . Lung cancer Father     Social History   Socioeconomic History  . Marital status: Single    Spouse name: Not on file  . Number of children: Not on file  . Years of education: Not on file  . Highest education level: Not on file  Occupational History  . Not on file  Tobacco Use  . Smoking status: Never Smoker  . Smokeless tobacco: Never Used  Substance and Sexual Activity  . Alcohol use: No    Alcohol/week: 0.0 standard drinks  . Drug use: No  . Sexual activity: Yes    Birth control/protection: None  Other Topics Concern  . Not on file  Social History Narrative  . Not on file   Social Determinants of Health   Financial Resource Strain:   . Difficulty of Paying Living Expenses:   Food Insecurity:   . Worried About Charity fundraiser in the Last Year:   . Arboriculturist in the Last Year:   Transportation Needs:   . Film/video editor (Medical):   Marland Kitchen Lack of Transportation (Non-Medical):   Physical Activity:   . Days of Exercise per Week:   . Minutes of Exercise per Session:   Stress:   . Feeling of Stress :   Social Connections:   . Frequency of Communication with Friends and Family:   . Frequency of Social Gatherings with Friends and Family:   . Attends Religious Services:   . Active Member of Clubs or Organizations:   . Attends Archivist Meetings:   Marland Kitchen Marital Status:   Intimate Partner Violence:   . Fear of Current or Ex-Partner:   . Emotionally Abused:   Marland Kitchen Physically Abused:   . Sexually Abused:      ROS: [x]  Positive   [ ]  Negative   [ ]  All sytems reviewed and are negative   Cardiac: []  chest pain/pressure []  SOB []  DOE  Vascular: []  pain in legs while walking []  pain in feet when lying flat []  hx of DVT []  swelling in legs  Pulmonary: []  asthma []  wheezing  Neurologic: []  weakness in []  arms []  legs []  numbness in []  arms []  legs [] difficulty speaking or slurred speech []  temporary loss of vision in one eye []  dizziness  Hematologic: []  bleeding problems  GI []  GERD  GU: [x]  CKD/renal failure  [x]  HD---[x]   M/W/F []  T/T/S  Psychiatric: []  hx of major depression  Integumentary: []  rashes []  ulcers  Constitutional: []  fever []  chills  PHYSICAL EXAMINATION:  Today's Vitals   06/27/19 1552 06/27/19 1553  BP: 115/71   Pulse: 78   Resp: 18   Temp: (!) 97.4 F (36.3 C)   TempSrc: Temporal   SpO2: 98%   Weight: 144 lb 8 oz (65.5 kg)   Height: 5\' 3"  (1.6 m)   PainSc:  0-No pain   Body mass index is 25.6 kg/m.    General:  WDWN male in NAD Gait: Normal HENT: WNL Pulmonary: normal non-labored breathing , without Rales, rhonchi,  wheezing Cardiac: regular Skin: without rashes, without ulcers  Vascular Exam/Pulses:   Right Left  Radial 2+ (normal) 2+ (normal)  Ulnar Unable to palpate  Unable to palpate    Extremities:  Two aneurysmal areas over the left RC AVF.  There is a small hypopigmented area with skin in tact.  There are no scabs or ulcers.  There is an excellent thrill in the fistula.  2+ bilateral radial artery pulse Musculoskeletal: no muscle wasting or atrophy  Neurologic: A&O X 3; Speech is fluent/normal   Non-Invasive Vascular Imaging:   None today   ASSESSMENT/PLAN: 49 y.o. male with ESRD here for evaluation of his hemodialysis access   -pt seen and examined with Dr. Donnetta Hutching with interpreter on [place].  Pt denies any bleeding issues from his fistula, pain or issues while dialyzing.  Would not recommend surgery at this point as he does not have any ulcers or scabs that are at risk for bleeding.  Will  see him back if he develops a scab or ulcer that is at risk for bleeding or other issues.  -continue to avoid hypopigmented area and continue to rotate sites.  Discussed with pt that the fistulas do not last forever and he may need surgical intervention in the future or even new access.  He expresses understanding.   -pt is on dialysis M/W/F at the [facility] location    Leontine Locket, [facility] [phone]  Clinic MD:   Pt seen and examined with Dr. Donnetta Hutching

## 2020-01-04 ENCOUNTER — Ambulatory Visit (INDEPENDENT_AMBULATORY_CARE_PROVIDER_SITE_OTHER): Payer: Self-pay | Admitting: Physician Assistant

## 2020-01-04 ENCOUNTER — Other Ambulatory Visit: Payer: Self-pay

## 2020-01-04 VITALS — BP 125/78 | HR 76 | Temp 98.2°F | Resp 20 | Ht 63.0 in | Wt 141.5 lb

## 2020-01-04 DIAGNOSIS — N186 End stage renal disease: Secondary | ICD-10-CM

## 2020-01-04 DIAGNOSIS — Z992 Dependence on renal dialysis: Secondary | ICD-10-CM

## 2020-01-04 NOTE — Progress Notes (Signed)
Established Dialysis Access   History of Present Illness   Cole [name] is a 49 y.o. (10-10-1970) male who presents for re-evaluation of permanent access.  He is status post left radiocephalic fistula creation by Dr. Donnetta Hutching on 10/05/2011.  He underwent plication of left radiocephalic fistula on 0/09/3816 by Dr. Bridgett Larsson.  He was referred back to our office by Dr. Freddrick March for evaluation of scab overlying aneurysmal area of fistula.  He is dialyzing without complication on a Monday Wednesday Friday schedule at the [facility] location.  He denies any bleeding episodes from the area in question.  He has tried triple antibiotic ointment however it does not seem to be helping this area.  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  The patient's PMH, PSH, SH, and FamHx were reviewed oand are unchanged from prior visit.  Current Outpatient Medications  Medication Sig Dispense Refill  . allopurinol (ZYLOPRIM) 100 MG tablet Take 200 mg by mouth daily.    . ASPIRIN 81 PO Aspir-81 81 mg    . calcium elemental as carbonate (BARIATRIC TUMS ULTRA) 400 MG chewable tablet Chew 800 mg by mouth 3 (three) times daily with meals.     . carvedilol (COREG) 6.25 MG tablet Take 1 tablet (6.25 mg total) by mouth as directed. As needed- for breakthrough afib (Patient taking differently: Take 6.25 mg by mouth 2 (two) times a day.) 30 tablet 0  . clonazePAM (KLONOPIN) 2 MG tablet Take 2 mg by mouth daily as needed (cramps).     . heparin 1000 unit/mL SOLN injection Heparin Sodium (Porcine) 1,000 Units/mL Systemic    . multivitamin (RENA-VIT) TABS tablet Take 1 tablet by mouth at bedtime.    . simvastatin (ZOCOR) 20 MG tablet Take 20 mg by mouth at bedtime.     Marland Kitchen VITAMIN D PO Take by mouth.     No current facility-administered medications for this visit.    REVIEW OF SYSTEMS (negative unless checked):   Cardiac:  []  Chest pain or chest pressure? []  Shortness of breath upon activity? []  Shortness of  breath when lying flat? []  Irregular heart rhythm?  Vascular:  []  Pain in calf, thigh, or hip brought on by walking? []  Pain in feet at night that wakes you up from your sleep? []  Blood clot in your veins? []  Leg swelling?  Pulmonary:  []  Oxygen at home? []  Productive cough? []  Wheezing?  Neurologic:  []  Sudden weakness in arms or legs? []  Sudden numbness in arms or legs? []  Sudden onset of difficult speaking or slurred speech? []  Temporary loss of vision in one eye? []  Problems with dizziness?  Gastrointestinal:  []  Blood in stool? []  Vomited blood?  Genitourinary:  []  Burning when urinating? []  Blood in urine?  Psychiatric:  []  Major depression  Hematologic:  []  Bleeding problems? []  Problems with blood clotting?  Dermatologic:  []  Rashes or ulcers?  Constitutional:  []  Fever or chills?  Ear/Nose/Throat:  []  Change in hearing? []  Nose bleeds? []  Sore throat?  Musculoskeletal:  []  Back pain? []  Joint pain? []  Muscle pain?   Physical Examination   Vitals:   01/04/20 1107  BP: 125/78  Pulse: 76  Resp: 20  Temp: 98.2 F (36.8 C)  TempSrc: Temporal  SpO2: 94%  Weight: 141 lb 8 oz (64.2 kg)  Height: 5\' 3"  (1.6 m)   Body mass index is 25.07 kg/m.  General:  WDWN in NAD; vital signs documented above Gait: Not observed HENT:  WNL, normocephalic Pulmonary: normal non-labored breathing Cardiac: regular HR Abdomen: soft, NT, no masses Skin: without rashes Vascular Exam/Pulses:  Right Left  Radial 2+ (normal) 2+ (normal)   Extremities: Palpable thrill throughout left radiocephalic fistula; area in question pictured below    Musculoskeletal: no muscle wasting or atrophy  Neurologic: A&O X 3;  No focal weakness or paresthesias are detected Psychiatric:  The pt has Normal affect.    Medical Decision Making   Cole [name] is a 49 y.o. male who presents with ESRD requiring hemodialysis.    Left radiocephalic fistula patent with  palpable thrill throughout  There is a diffuse aneurysmal segment with overlying scabbing and immobile skin; this area is at risk for spontaneous bleeding  Plan will be for plication of AV fistula in this area; I explained to the patient that given the location of the ulceration, he may require placement of tunneled dialysis catheter however the surgeon will discuss this further with the patient preoperatively  He agrees to proceed with the above procedure   Cole Ligas PA-C [facility] Office: [phone]  Clinic MD: Oneida Alar

## 2020-01-04 NOTE — H&P (View-Only) (Signed)
Established Dialysis Access   History of Present Illness   Cole [name] is a 49 y.o. ([birth year]) male who presents for re-evaluation of permanent access.  He is status post left radiocephalic fistula creation by Dr. Donnetta Hutching on 10/05/2011.  He underwent plication of left radiocephalic fistula on 04/30/4096 by Dr. Bridgett Larsson.  He was referred back to our office by Dr. Freddrick March for evaluation of scab overlying aneurysmal area of fistula.  He is dialyzing without complication on a Monday Wednesday Friday schedule at the [facility] location.  He denies any bleeding episodes from the area in question.  He has tried triple antibiotic ointment however it does not seem to be helping this area.  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  The patient's PMH, PSH, SH, and FamHx were reviewed oand are unchanged from prior visit.  Current Outpatient Medications  Medication Sig Dispense Refill  . allopurinol (ZYLOPRIM) 100 MG tablet Take 200 mg by mouth daily.    . ASPIRIN 81 PO Aspir-81 81 mg    . calcium elemental as carbonate (BARIATRIC TUMS ULTRA) 400 MG chewable tablet Chew 800 mg by mouth 3 (three) times daily with meals.     . carvedilol (COREG) 6.25 MG tablet Take 1 tablet (6.25 mg total) by mouth as directed. As needed- for breakthrough afib (Patient taking differently: Take 6.25 mg by mouth 2 (two) times a day.) 30 tablet 0  . clonazePAM (KLONOPIN) 2 MG tablet Take 2 mg by mouth daily as needed (cramps).     . heparin 1000 unit/mL SOLN injection Heparin Sodium (Porcine) 1,000 Units/mL Systemic    . multivitamin (RENA-VIT) TABS tablet Take 1 tablet by mouth at bedtime.    . simvastatin (ZOCOR) 20 MG tablet Take 20 mg by mouth at bedtime.     Marland Kitchen VITAMIN D PO Take by mouth.     No current facility-administered medications for this visit.    REVIEW OF SYSTEMS (negative unless checked):   Cardiac:  []  Chest pain or chest pressure? []  Shortness of breath upon activity? []  Shortness of  breath when lying flat? []  Irregular heart rhythm?  Vascular:  []  Pain in calf, thigh, or hip brought on by walking? []  Pain in feet at night that wakes you up from your sleep? []  Blood clot in your veins? []  Leg swelling?  Pulmonary:  []  Oxygen at home? []  Productive cough? []  Wheezing?  Neurologic:  []  Sudden weakness in arms or legs? []  Sudden numbness in arms or legs? []  Sudden onset of difficult speaking or slurred speech? []  Temporary loss of vision in one eye? []  Problems with dizziness?  Gastrointestinal:  []  Blood in stool? []  Vomited blood?  Genitourinary:  []  Burning when urinating? []  Blood in urine?  Psychiatric:  []  Major depression  Hematologic:  []  Bleeding problems? []  Problems with blood clotting?  Dermatologic:  []  Rashes or ulcers?  Constitutional:  []  Fever or chills?  Ear/Nose/Throat:  []  Change in hearing? []  Nose bleeds? []  Sore throat?  Musculoskeletal:  []  Back pain? []  Joint pain? []  Muscle pain?   Physical Examination   Vitals:   01/04/20 1107  BP: 125/78  Pulse: 76  Resp: 20  Temp: 98.2 F (36.8 C)  TempSrc: Temporal  SpO2: 94%  Weight: 141 lb 8 oz (64.2 kg)  Height: 5\' 3"  (1.6 m)   Body mass index is 25.07 kg/m.  General:  WDWN in NAD; vital signs documented above Gait: Not observed HENT:  WNL, normocephalic Pulmonary: normal non-labored breathing Cardiac: regular HR Abdomen: soft, NT, no masses Skin: without rashes Vascular Exam/Pulses:  Right Left  Radial 2+ (normal) 2+ (normal)   Extremities: Palpable thrill throughout left radiocephalic fistula; area in question pictured below    Musculoskeletal: no muscle wasting or atrophy  Neurologic: A&O X 3;  No focal weakness or paresthesias are detected Psychiatric:  The pt has Normal affect.    Medical Decision Making   Cole [name] is a 49 y.o. male who presents with ESRD requiring hemodialysis.    Left radiocephalic fistula patent with  palpable thrill throughout  There is a diffuse aneurysmal segment with overlying scabbing and immobile skin; this area is at risk for spontaneous bleeding  Plan will be for plication of AV fistula in this area; I explained to the patient that given the location of the ulceration, he may require placement of tunneled dialysis catheter however the surgeon will discuss this further with the patient preoperatively  He agrees to proceed with the above procedure   Dagoberto Ligas PA-C [facility] [place] Office: [phone]  Clinic MD: Oneida Alar

## 2020-01-06 ENCOUNTER — Other Ambulatory Visit (HOSPITAL_COMMUNITY)
Admission: RE | Admit: 2020-01-06 | Discharge: 2020-01-06 | Disposition: A | Discharge status: Home / Self Care | Payer: Self-pay | Source: Ambulatory Visit | Attending: Vascular Surgery | Admitting: Vascular Surgery

## 2020-01-06 DIAGNOSIS — Z20822 Contact with and (suspected) exposure to covid-19: Secondary | ICD-10-CM | POA: Insufficient documentation

## 2020-01-06 DIAGNOSIS — Z01812 Encounter for preprocedural laboratory examination: Secondary | ICD-10-CM | POA: Insufficient documentation

## 2020-01-07 LAB — SARS CORONAVIRUS 2 (TAT 6-24 HRS): SARS Coronavirus 2: NEGATIVE

## 2020-01-08 ENCOUNTER — Other Ambulatory Visit: Payer: Self-pay

## 2020-01-08 ENCOUNTER — Encounter (HOSPITAL_COMMUNITY): Payer: Self-pay | Admitting: Vascular Surgery

## 2020-01-08 NOTE — Progress Notes (Signed)
Patient is Spanish speaking.  Used [facility] ID [phone] for PAT information.  Unable to reach patient, no voicemail, unable to LM.

## 2020-01-08 NOTE — Progress Notes (Signed)
Patient is Spanish Speaking.  Used [employer] ID [phone] for PAT information.  Patient does not have any SOB, fever, cough or chest congestion.  PCP - Uses Doctor at [facility]  Cardiologist - n/a  Chest x-ray - n/a EKG - DOS 01/09/20 Stress Test - 10/02/14 ECHO - 01/31/18 Cardiac Cath - 10/23/14  Anesthesia review: Yes  STOP now taking any Aspirin (unless otherwise instructed by your surgeon), Aleve, Naproxen, Ibuprofen, Motrin, Advil, Goody's, BC's, all herbal medications, fish oil, and all vitamins.   Coronavirus Screening Covid test on 01/06/20 was negative.  Patient verbalized understanding of instructions that were given via phone.

## 2020-01-09 ENCOUNTER — Ambulatory Visit (HOSPITAL_COMMUNITY): Payer: Self-pay | Admitting: Physician Assistant

## 2020-01-09 ENCOUNTER — Ambulatory Visit (HOSPITAL_COMMUNITY)
Admission: RE | Admit: 2020-01-09 | Discharge: 2020-01-09 | Disposition: A | Discharge status: Home / Self Care | Payer: Self-pay | Attending: Vascular Surgery | Admitting: Vascular Surgery

## 2020-01-09 ENCOUNTER — Other Ambulatory Visit: Payer: Self-pay

## 2020-01-09 ENCOUNTER — Encounter (HOSPITAL_COMMUNITY)
Admission: RE | Disposition: A | Discharge status: Home / Self Care | Payer: Self-pay | Source: Home / Self Care | Attending: Vascular Surgery

## 2020-01-09 ENCOUNTER — Encounter (HOSPITAL_COMMUNITY): Payer: Self-pay | Admitting: Vascular Surgery

## 2020-01-09 DIAGNOSIS — T82898A Other specified complication of vascular prosthetic devices, implants and grafts, initial encounter: Secondary | ICD-10-CM

## 2020-01-09 DIAGNOSIS — Z79899 Other long term (current) drug therapy: Secondary | ICD-10-CM | POA: Insufficient documentation

## 2020-01-09 DIAGNOSIS — N186 End stage renal disease: Secondary | ICD-10-CM | POA: Insufficient documentation

## 2020-01-09 DIAGNOSIS — Z992 Dependence on renal dialysis: Secondary | ICD-10-CM | POA: Insufficient documentation

## 2020-01-09 DIAGNOSIS — N185 Chronic kidney disease, stage 5: Secondary | ICD-10-CM

## 2020-01-09 DIAGNOSIS — Z7982 Long term (current) use of aspirin: Secondary | ICD-10-CM | POA: Insufficient documentation

## 2020-01-09 HISTORY — DX: Gout, unspecified: M10.9

## 2020-01-09 LAB — POCT I-STAT, CHEM 8
BUN: 50 mg/dL — ABNORMAL HIGH (ref 6–20)
Calcium, Ion: 1.1 mmol/L — ABNORMAL LOW (ref 1.15–1.40)
Chloride: 97 mmol/L — ABNORMAL LOW (ref 98–111)
Creatinine, Ser: 10 mg/dL — ABNORMAL HIGH (ref 0.61–1.24)
Glucose, Bld: 109 mg/dL — ABNORMAL HIGH (ref 70–99)
HCT: 39 % (ref 39.0–52.0)
Hemoglobin: 13.3 g/dL (ref 13.0–17.0)
Potassium: 4.9 mmol/L (ref 3.5–5.1)
Sodium: 135 mmol/L (ref 135–145)
TCO2: 29 mmol/L (ref 22–32)

## 2020-01-09 SURGERY — FISTULA SUPERFICIALIZATION
Anesthesia: Monitor Anesthesia Care | Laterality: Left

## 2020-01-09 MED ORDER — MIDAZOLAM HCL 5 MG/5ML IJ SOLN
INTRAMUSCULAR | Status: DC | PRN
  Administered 2020-01-09 (×2): 1 mg via INTRAVENOUS

## 2020-01-09 MED ORDER — SODIUM CHLORIDE 0.9 % IV SOLN: INTRAVENOUS | Status: DC | PRN

## 2020-01-09 MED ORDER — CHLORHEXIDINE GLUCONATE 4 % EX LIQD: 60.0000 mL | Freq: Once | CUTANEOUS | Status: DC

## 2020-01-09 MED ORDER — FENTANYL CITRATE (PF) 250 MCG/5ML IJ SOLN
INTRAMUSCULAR | Status: DC | PRN
  Administered 2020-01-09 (×2): 25 ug via INTRAVENOUS

## 2020-01-09 MED ORDER — LIDOCAINE 2% (20 MG/ML) 5 ML SYRINGE
INTRAMUSCULAR | Status: DC | PRN
  Administered 2020-01-09: 80 mg via INTRAVENOUS

## 2020-01-09 MED ORDER — PHENYLEPHRINE 40 MCG/ML (10ML) SYRINGE FOR IV PUSH (FOR BLOOD PRESSURE SUPPORT)
PREFILLED_SYRINGE | INTRAVENOUS | Status: DC | PRN
  Administered 2020-01-09: 80 ug via INTRAVENOUS

## 2020-01-09 MED ORDER — FENTANYL CITRATE (PF) 250 MCG/5ML IJ SOLN
INTRAMUSCULAR | Status: AC
  Filled 2020-01-09: qty 5

## 2020-01-09 MED ORDER — CEFAZOLIN SODIUM-DEXTROSE 2-4 GM/100ML-% IV SOLN
2.0000 g | INTRAVENOUS | Status: AC
  Administered 2020-01-09: 2 g via INTRAVENOUS
  Filled 2020-01-09: qty 100

## 2020-01-09 MED ORDER — DEXAMETHASONE SODIUM PHOSPHATE 10 MG/ML IJ SOLN
INTRAMUSCULAR | Status: AC
  Filled 2020-01-09: qty 1

## 2020-01-09 MED ORDER — LACTATED RINGERS IV SOLN: INTRAVENOUS | Status: DC

## 2020-01-09 MED ORDER — SODIUM CHLORIDE 0.9 % IV SOLN: INTRAVENOUS | Status: DC

## 2020-01-09 MED ORDER — CHLORHEXIDINE GLUCONATE 0.12 % MT SOLN
15.0000 mL | Freq: Once | OROMUCOSAL | Status: AC
  Administered 2020-01-09: 15 mL via OROMUCOSAL
  Filled 2020-01-09: qty 15

## 2020-01-09 MED ORDER — SODIUM CHLORIDE 0.9 % IV SOLN
INTRAVENOUS | Status: AC
  Filled 2020-01-09: qty 1.2

## 2020-01-09 MED ORDER — PHENYLEPHRINE HCL-NACL 10-0.9 MG/250ML-% IV SOLN
INTRAVENOUS | Status: DC | PRN
  Administered 2020-01-09: 25 ug/min via INTRAVENOUS

## 2020-01-09 MED ORDER — HYDROCODONE-ACETAMINOPHEN 5-325 MG PO TABS: 1.0000 | ORAL_TABLET | ORAL | 0 refills | Status: DC | PRN

## 2020-01-09 MED ORDER — PROPOFOL 10 MG/ML IV BOLUS
INTRAVENOUS | Status: DC | PRN
  Administered 2020-01-09: 140 mg via INTRAVENOUS

## 2020-01-09 MED ORDER — LIDOCAINE-EPINEPHRINE (PF) 1 %-1:200000 IJ SOLN
INTRAMUSCULAR | Status: AC
  Filled 2020-01-09: qty 30

## 2020-01-09 MED ORDER — HEMOSTATIC AGENTS (NO CHARGE) OPTIME
TOPICAL | Status: DC | PRN
  Administered 2020-01-09: 1

## 2020-01-09 MED ORDER — ORAL CARE MOUTH RINSE: 15.0000 mL | Freq: Once | OROMUCOSAL | Status: AC

## 2020-01-09 MED ORDER — PROMETHAZINE HCL 25 MG/ML IJ SOLN
INTRAMUSCULAR | Status: AC
  Filled 2020-01-09: qty 1

## 2020-01-09 MED ORDER — ONDANSETRON HCL 4 MG/2ML IJ SOLN
INTRAMUSCULAR | Status: DC | PRN
  Administered 2020-01-09: 4 mg via INTRAVENOUS

## 2020-01-09 MED ORDER — ONDANSETRON HCL 4 MG/2ML IJ SOLN
INTRAMUSCULAR | Status: AC
  Filled 2020-01-09: qty 4

## 2020-01-09 MED ORDER — PROMETHAZINE HCL 25 MG/ML IJ SOLN
6.2500 mg | Freq: Once | INTRAMUSCULAR | Status: AC
  Administered 2020-01-09: 6.25 mg via INTRAVENOUS

## 2020-01-09 MED ORDER — FENTANYL CITRATE (PF) 100 MCG/2ML IJ SOLN
25.0000 ug | INTRAMUSCULAR | Status: DC | PRN
Start: 2020-01-09 — End: 2020-01-09

## 2020-01-09 MED ORDER — PAPAVERINE HCL 30 MG/ML IJ SOLN
INTRAMUSCULAR | Status: AC
  Filled 2020-01-09: qty 2

## 2020-01-09 MED ORDER — ACETAMINOPHEN 500 MG PO TABS
1000.0000 mg | ORAL_TABLET | Freq: Once | ORAL | Status: AC
  Filled 2020-01-09: qty 2

## 2020-01-09 MED ORDER — MIDAZOLAM HCL 2 MG/2ML IJ SOLN
INTRAMUSCULAR | Status: AC
  Filled 2020-01-09: qty 2

## 2020-01-09 MED ORDER — PHENYLEPHRINE 40 MCG/ML (10ML) SYRINGE FOR IV PUSH (FOR BLOOD PRESSURE SUPPORT)
PREFILLED_SYRINGE | INTRAVENOUS | Status: AC
  Filled 2020-01-09: qty 10

## 2020-01-09 MED ORDER — DEXAMETHASONE SODIUM PHOSPHATE 10 MG/ML IJ SOLN
INTRAMUSCULAR | Status: DC | PRN
  Administered 2020-01-09: 5 mg via INTRAVENOUS

## 2020-01-09 MED ORDER — ACETAMINOPHEN 500 MG PO TABS
ORAL_TABLET | ORAL | Status: AC
  Administered 2020-01-09: 1000 mg via ORAL
  Filled 2020-01-09: qty 2

## 2020-01-09 MED ORDER — 0.9 % SODIUM CHLORIDE (POUR BTL) OPTIME
TOPICAL | Status: DC | PRN
  Administered 2020-01-09: 1000 mL

## 2020-01-09 SURGICAL SUPPLY — 35 items
ADH SKN CLS APL DERMABOND .7 (GAUZE/BANDAGES/DRESSINGS) ×1
ARMBAND PINK RESTRICT EXTREMIT (MISCELLANEOUS) ×2 IMPLANT
BANDAGE ESMARK 6X9 LF (GAUZE/BANDAGES/DRESSINGS) IMPLANT
BNDG CMPR 9X6 STRL LF SNTH (GAUZE/BANDAGES/DRESSINGS) ×1
BNDG ESMARK 6X9 LF (GAUZE/BANDAGES/DRESSINGS) ×2
CANISTER SUCT 3000ML PPV (MISCELLANEOUS) ×2 IMPLANT
CLIP VESOCCLUDE MED 6/CT (CLIP) ×2 IMPLANT
CLIP VESOCCLUDE SM WIDE 6/CT (CLIP) ×2 IMPLANT
COVER PROBE W GEL 5X96 (DRAPES) ×2 IMPLANT
COVER WAND RF STERILE (DRAPES) ×2 IMPLANT
CUFF TOURN SGL QUICK 18X4 (TOURNIQUET CUFF) ×1 IMPLANT
DERMABOND ADVANCED (GAUZE/BANDAGES/DRESSINGS) ×1
DERMABOND ADVANCED .7 DNX12 (GAUZE/BANDAGES/DRESSINGS) ×1 IMPLANT
ELECT REM PT RETURN 9FT ADLT (ELECTROSURGICAL) ×2
ELECTRODE REM PT RTRN 9FT ADLT (ELECTROSURGICAL) ×1 IMPLANT
GLOVE BIO SURGEON STRL SZ7.5 (GLOVE) ×2 IMPLANT
GOWN STRL REUS W/ TWL LRG LVL3 (GOWN DISPOSABLE) ×2 IMPLANT
GOWN STRL REUS W/ TWL XL LVL3 (GOWN DISPOSABLE) ×1 IMPLANT
GOWN STRL REUS W/TWL LRG LVL3 (GOWN DISPOSABLE) ×4
GOWN STRL REUS W/TWL XL LVL3 (GOWN DISPOSABLE) ×2
HEMOSTAT SNOW SURGICEL 2X4 (HEMOSTASIS) ×1 IMPLANT
KIT BASIN OR (CUSTOM PROCEDURE TRAY) ×2 IMPLANT
KIT TURNOVER KIT B (KITS) ×2 IMPLANT
NS IRRIG 1000ML POUR BTL (IV SOLUTION) ×2 IMPLANT
PACK CV ACCESS (CUSTOM PROCEDURE TRAY) ×2 IMPLANT
PAD ARMBOARD 7.5X6 YLW CONV (MISCELLANEOUS) ×4 IMPLANT
SUT MNCRL AB 4-0 PS2 18 (SUTURE) ×4 IMPLANT
SUT PROLENE 5 0 C 1 24 (SUTURE) ×1 IMPLANT
SUT PROLENE 5 0 C 1 36 (SUTURE) ×1 IMPLANT
SUT PROLENE 6 0 BV (SUTURE) ×3 IMPLANT
SUT VIC AB 3-0 SH 27 (SUTURE) ×4
SUT VIC AB 3-0 SH 27X BRD (SUTURE) ×1 IMPLANT
TOWEL GREEN STERILE (TOWEL DISPOSABLE) ×2 IMPLANT
UNDERPAD 30X36 HEAVY ABSORB (UNDERPADS AND DIAPERS) ×2 IMPLANT
WATER STERILE IRR 1000ML POUR (IV SOLUTION) ×2 IMPLANT

## 2020-01-09 NOTE — Anesthesia Procedure Notes (Addendum)
Procedure Name: LMA Insertion Date/Time: 01/09/2020 9:13 AM Performed by: Imagene Riches, CRNA Pre-anesthesia Checklist: Patient identified, Emergency Drugs available, Suction available and Patient being monitored Patient Re-evaluated:Patient Re-evaluated prior to induction Oxygen Delivery Method: Circle System Utilized Preoxygenation: Pre-oxygenation with 100% oxygen Induction Type: IV induction Ventilation: Mask ventilation without difficulty LMA: LMA inserted LMA Size: 4.0 Number of attempts: 1 Airway Equipment and Method: Bite block Placement Confirmation: positive ETCO2 Tube secured with: Tape Dental Injury: Teeth and Oropharynx as per pre-operative assessment  Comments: Patient could tolerate LMA 5

## 2020-01-09 NOTE — Anesthesia Preprocedure Evaluation (Addendum)
Anesthesia Evaluation  Patient identified by MRN, date of birth, ID band Patient awake    Reviewed: Allergy & Precautions, NPO status , Patient's Chart, lab work & pertinent test results  Airway Mallampati: II  TM Distance: >3 FB Neck ROM: Full    Dental  (+) Dental Advisory Given   Pulmonary neg pulmonary ROS,    breath sounds clear to auscultation       Cardiovascular hypertension, Pt. on home beta blockers and Pt. on medications  Rhythm:Regular Rate:Normal     Neuro/Psych negative neurological ROS     GI/Hepatic Neg liver ROS, GERD  ,  Endo/Other  negative endocrine ROS  Renal/GU ESRF and DialysisRenal disease     Musculoskeletal   Abdominal   Peds  Hematology negative hematology ROS (+)   Anesthesia Other Findings   Reproductive/Obstetrics                             Lab Results  Component Value Date   WBC 6.7 11/22/2018   HGB 12.2 (L) 11/22/2018   HCT 37.5 (L) 11/22/2018   MCV 91.0 11/22/2018   PLT 125 (L) 11/22/2018   Lab Results  Component Value Date   CREATININE 10.97 (H) 11/22/2018   BUN 56 (H) 11/22/2018   NA 138 11/22/2018   K 4.7 11/22/2018   CL 92 (L) 11/22/2018   CO2 29 11/22/2018    Anesthesia Physical Anesthesia Plan  ASA: III  Anesthesia Plan: General   Post-op Pain Management:    Induction: Intravenous  PONV Risk Score and Plan: 1 and Propofol infusion, Ondansetron and Treatment may vary due to age or medical condition  Airway Management Planned: LMA  Additional Equipment:   Intra-op Plan:   Post-operative Plan: Extubation in OR  Informed Consent: I have reviewed the patients History and Physical, chart, labs and discussed the procedure including the risks, benefits and alternatives for the proposed anesthesia with the patient or authorized representative who has indicated his/her understanding and acceptance.     Dental advisory  given  Plan Discussed with: CRNA  Anesthesia Plan Comments:        Anesthesia Quick Evaluation

## 2020-01-09 NOTE — Discharge Instructions (Signed)
Cuidados de la incisin en los adultos Incision Care, Adult Una incisin es un corte que el mdico hace en la piel para realizar una ciruga (un procedimiento). En la [employer], estas incisiones se cierran despus de la [place]. El corte se puede cerrar con puntos (suturas), grapas, goma para cerrar la piel o tiras QJJHERDEY. Tendr que volver para que el mdico le saque los puntos o las grapas. [place] o semanas despus de la [place]. Es necesario brindarle al corte el cuidado necesario para evitar que se infecte. Cmo cuidar el corte Cuidado del corte   Siga las indicaciones del mdico en lo que respecta al cuidado del corte. Haga lo siguiente: ? Lvese las manos con agua y jabn antes de Quarry manager las vendas (vendajes). Use un desinfectante para manos si no dispone de [place] y [place]. ? Cambie el vendaje como se lo haya indicado el mdico. ? No retire los puntos, la goma para Radiographer, therapeutic piel ni las tiras [place]. Tal vez deban dejarse puestos en la piel durante 2semanas o ms tiempo. Si las tiras [place] se despegan y se enroscan, puede recortar los bordes sueltos. No retire las tiras [facility] por completo a menos que el mdico lo autorice.  Controle la zona del corte todos los das para detectar signos de infeccin. Est atento a los siguientes signos: ? Aumento del enrojecimiento, de la hinchazn o del dolor. ? Mayor presencia de lquido o [place]. ? [place]. ? Pus o mal olor.  Pregunte al mdico cmo limpiar el corte. Esto puede incluir lo siguiente: ? Usar [place] y [place]. ? Usar una toalla limpia para secar el corte dando palmaditas despus de limpiarlo. ? Aplicar una crema o ungento en el corte. Hgalo como el mdico se lo haya indicado. ? Cubrir el corte con una venda limpia.  Pregunte a su mdico cundo podr dejar descubierto el corte.  No tome baos de inmersin, no practique natacin ni use el jacuzzi hasta que el mdico la autorice.  Pregntele al mdico si puede ducharse. Thurston Pounds solo le permitan tomar baos de [place]. Medicamentos  Si le recetaron un antibitico, una crema o un ungento con antibitico, tmelo o aplquelo en el corte como se lo haya indicado el mdico. No deje de usar o [place] antibitico aunque la afeccin mejore.  Tome los medicamentos de venta libre y los recetados solamente como se lo haya indicado el mdico. Instrucciones generales  Limite el movimiento cerca del corte. Esto ayuda a la curacin. ? No haga esfuerzo, no levante objetos ni haga ejercicio durante el primer mes o durante el tiempo que le haya indicado el mdico. ? Siga las indicaciones del mdico respecto de cuando puede retornar a sus actividades normales. ? Pregntele al mdico qu actividades son seguras para usted.  Proteja el corte del sol cuando est al [employer] durante los primeros 33meses o durante el tiempo que le haya indicado el mdico. Aplique pantalla solar alrededor de la cicatriz o cbrala.  Concurra a todas las visitas de seguimiento como se lo haya indicado el mdico. Esto es importante. Comunquese con un mdico si:  Aumentan el enrojecimiento, la hinchazn o el dolor alrededor del corte.  Observa ms lquido o [employer] del corte.  El corte est caliente al tacto.  Tiene pus o percibe mal olor que emana del corte.  Siente escalofros o fiebre.  Tiene malestar estomacal (nuseas) o vomita.  Tiene mareos.  Se le salen los  puntos o las grapas. Solicite ayuda de inmediato si:  Tiene lneas rojas que salen de la incisin.  El corte le sangra a travs del vendaje y la hemorragia no se detiene al presionar con suavidad.  Los bordes de la incisin se abren y se separan.  Siente un dolor que es muy intenso.  Tiene una erupcin cutnea.  Se siente confundido.  Pierde el conocimiento (se desmaya).  Tiene dificultad para respirar y Location manager los latidos cardacos. Esta informacin no  tiene Marine scientist el consejo del mdico. Asegrese de hacerle al mdico cualquier pregunta que tenga. Document Revised: 10/15/2016 Document Reviewed: 07/31/2015 Elsevier Patient Education  [place].

## 2020-01-09 NOTE — Transfer of Care (Signed)
Immediate Anesthesia Transfer of Care Note  Patient: Cole [name]  Procedure(s) Performed: LEFT ARM FISTULA PLICATION (Left )  Patient Location: PACU  Anesthesia Type:General  Level of Consciousness: awake, alert  and oriented  Airway & Oxygen Therapy: Patient Spontanous Breathing and Patient connected to nasal cannula oxygen  Post-op Assessment: Report given to RN and Post -op Vital signs reviewed and stable  Post vital signs: Reviewed and stable  Last Vitals:  Vitals Value Taken Time  BP 125/83 01/09/20 1038  Temp    Pulse 60 01/09/20 1039  Resp 15 01/09/20 1039  SpO2 100 % 01/09/20 1039  Vitals shown include unvalidated device data.  Last Pain:  Vitals:   01/09/20 0837  TempSrc:   PainSc: 0-No pain         Complications: No complications documented.

## 2020-01-09 NOTE — Op Note (Signed)
    Patient name: Cole [name] MRN: [id number] DOB: 1970-08-10 Sex: male  01/09/2020 Pre-operative Diagnosis: esrd, ulceration of left arm av fistula Post-operative diagnosis:  Same Surgeon:  Eda Paschal. Donzetta Matters, MD Assistant: Risa Grill, PA Procedure Performed: Revision of left arm AV fistula with plication of pseudoaneurysm  Indications: 49 year old male referred to our office for thinned skin with ulceration overlying his fistula.  He is now indicated for revision with plication of the pseudoaneurysm.  I discussed with the patient that he likely has suitable fistula for cannulation following revision however if there is not going to be sufficient fistula for cannulation we will also place a catheter.  Assistant was necessary for suction, retraction, anastomotic and wound closure.  Findings: Skin was incised with the underlying fistula and the soft tissue was mobilized overlying the fistula.  We primarily repaired the fistula closed the skin over top of there was a strong thrill at completion.   Procedure:  The patient was identified in the holding area and taken to the operating where is placed supine on operative table and LMA anesthesia induced.  He was sterilely prepped and draped in left upper extremity usual fashion antibiotics were ministered a timeout was called.  We began by marking an elliptical type incision overlying the ulcerated aspect of the fistula.  We exsanguinated the left upper extremity with Esmarch and fistula was inflated to 250 mmHg.  We performed elliptical type incision with 10 blade dissected down with scissors and removed the skin subcutaneous tissue and superficial wall of the fistula with scissors.  We then dissected this off tissue off of the fistula.  We filled the fistula with heparinized saline.  This was closed with 5-0 Prolene suture in a running mattress fashion.  Prior to completion we again filled it with heparinized saline we then allowed our  tourniquet down.  There was one area that we needed to repair with interrupted 5-0 Prolene suture.  There was a strong thrill in the fistula confirmed with Doppler.  We irrigated the wounds obtaining the stasis with cautery and gentle pressure and then closed with interrupted Vicryl suture deep and 4 Monocryl at the level of the skin.  Dermabond was placed above this.  He was awakened from anesthesia having tolerated procedure without any complication.  All counts were correct at completion.  EBL: 20 cc   Dhanush Jokerst C. Donzetta Matters, MD [facility] Office: [phone] Pager: [phone]

## 2020-01-09 NOTE — Interval H&P Note (Signed)
History and Physical Interval Note:  01/09/2020 8:46 AM  Cole [name]  has presented today for surgery, with the diagnosis of ESRD.  The various methods of treatment have been discussed with the patient and family. After consideration of risks, benefits and other options for treatment, the patient has consented to  Procedure(s): LEFT ARM FISTULA PLICATION (Left) as a surgical intervention.  The patient's history has been reviewed, patient examined, no change in status, stable for surgery.  I have reviewed the patient's chart and labs.  Questions were answered to the patient's satisfaction.     [name]

## 2020-01-10 ENCOUNTER — Encounter (HOSPITAL_COMMUNITY): Payer: Self-pay | Admitting: Vascular Surgery

## 2020-01-10 NOTE — Anesthesia Postprocedure Evaluation (Signed)
Anesthesia Post Note  Patient: Cole [name]  Procedure(s) Performed: LEFT ARM FISTULA PLICATION (Left )     Patient location during evaluation: PACU Anesthesia Type: General Level of consciousness: awake and alert Pain management: pain level controlled Vital Signs Assessment: post-procedure vital signs reviewed and stable Respiratory status: spontaneous breathing, nonlabored ventilation, respiratory function stable and patient connected to nasal cannula oxygen Cardiovascular status: blood pressure returned to baseline and stable Postop Assessment: no apparent nausea or vomiting Anesthetic complications: no   No complications documented.  Last Vitals:  Vitals:   01/09/20 1055 01/09/20 1104  BP: (!) 146/84 (!) 140/91  Pulse: (!) 57 (!) 58  Resp: 15 13  Temp:    SpO2: 100% 99%    Last Pain:  Vitals:   01/09/20 1104  TempSrc:   PainSc: Asleep                 Tiajuana Amass

## 2020-01-24 IMAGING — DX DG CHEST 2V
2 series · 2 of 2 positions shown · non-contrast
Comparison: 11/07/2014 chest radiograph.

CLINICAL DATA: 47 y/o  M; chest pain and racing heart.

EXAM:
CHEST - 2 VIEW

[chest pa]
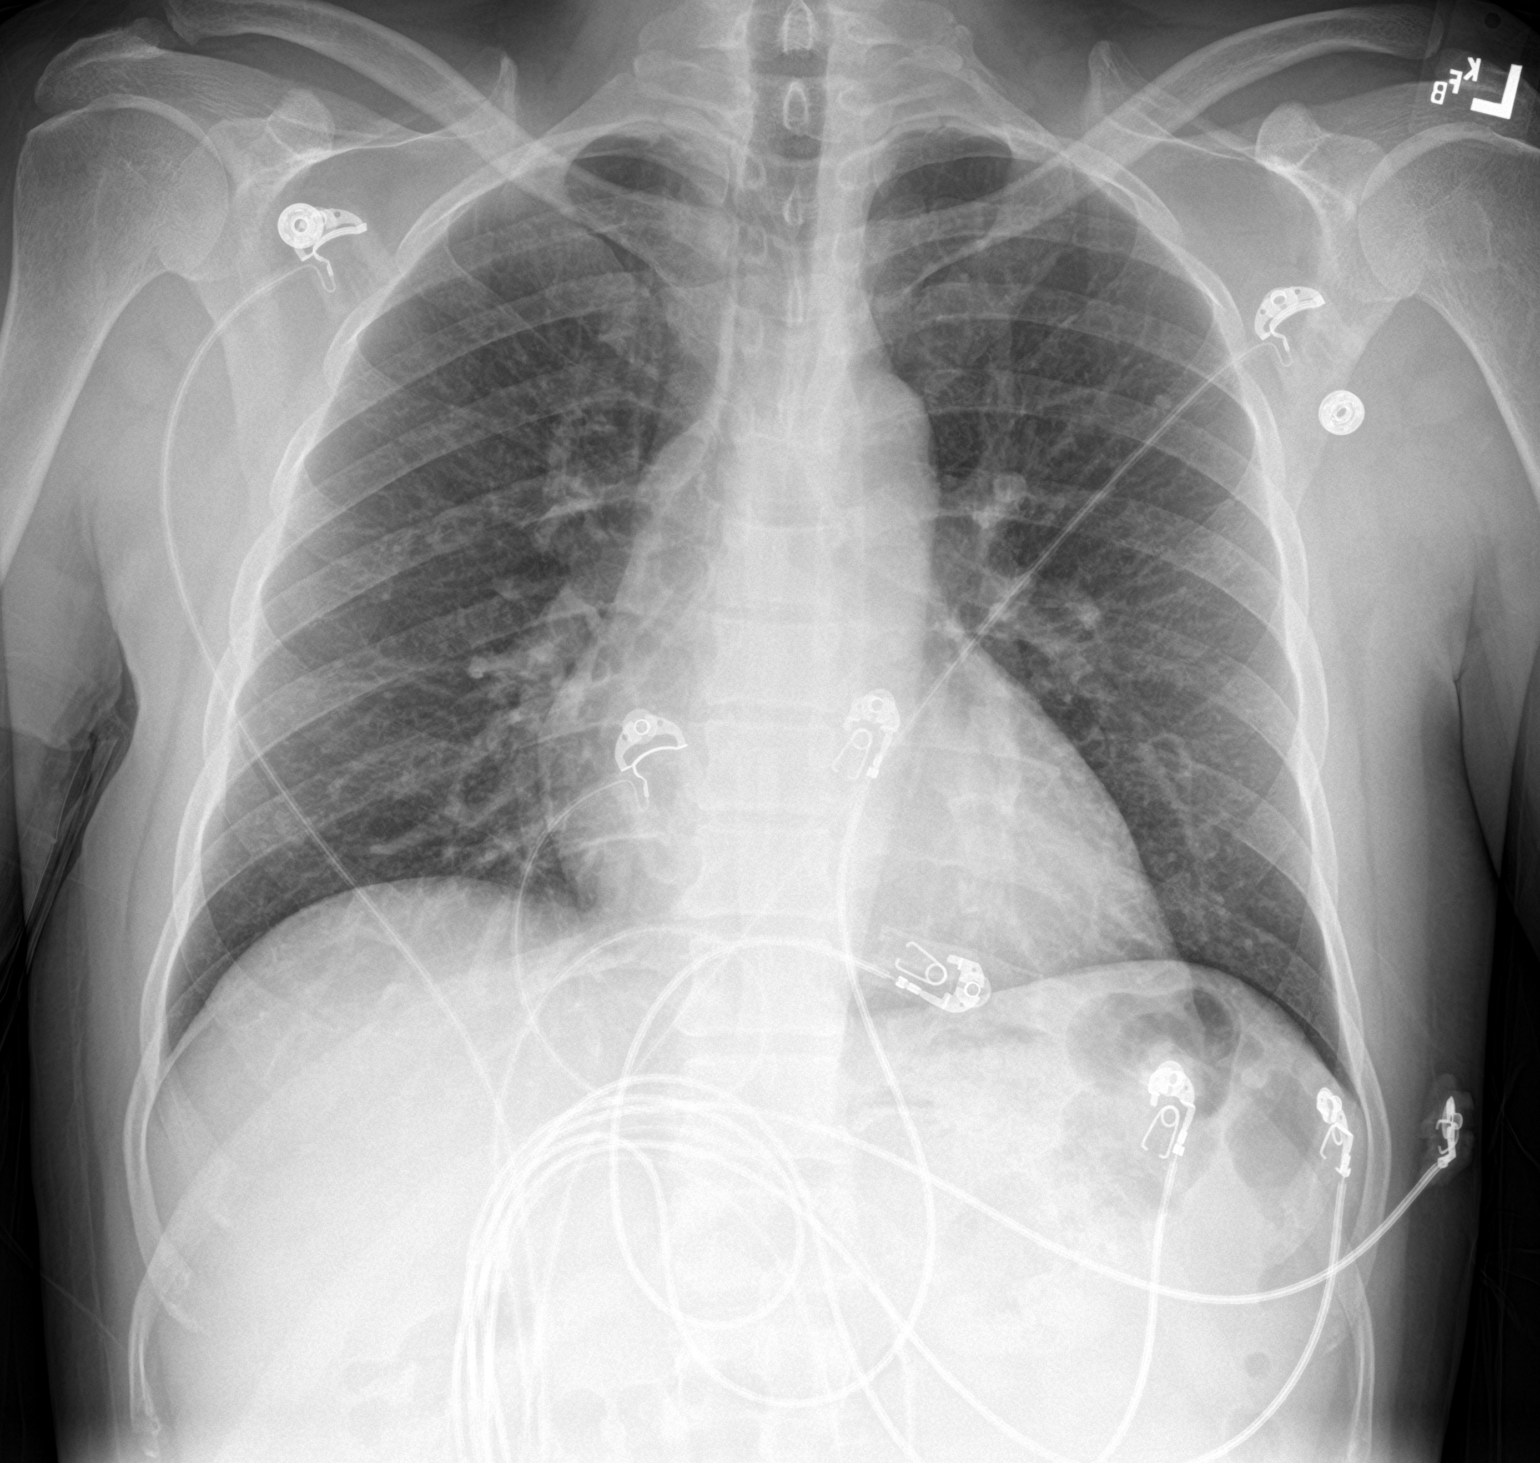

[chest lat]
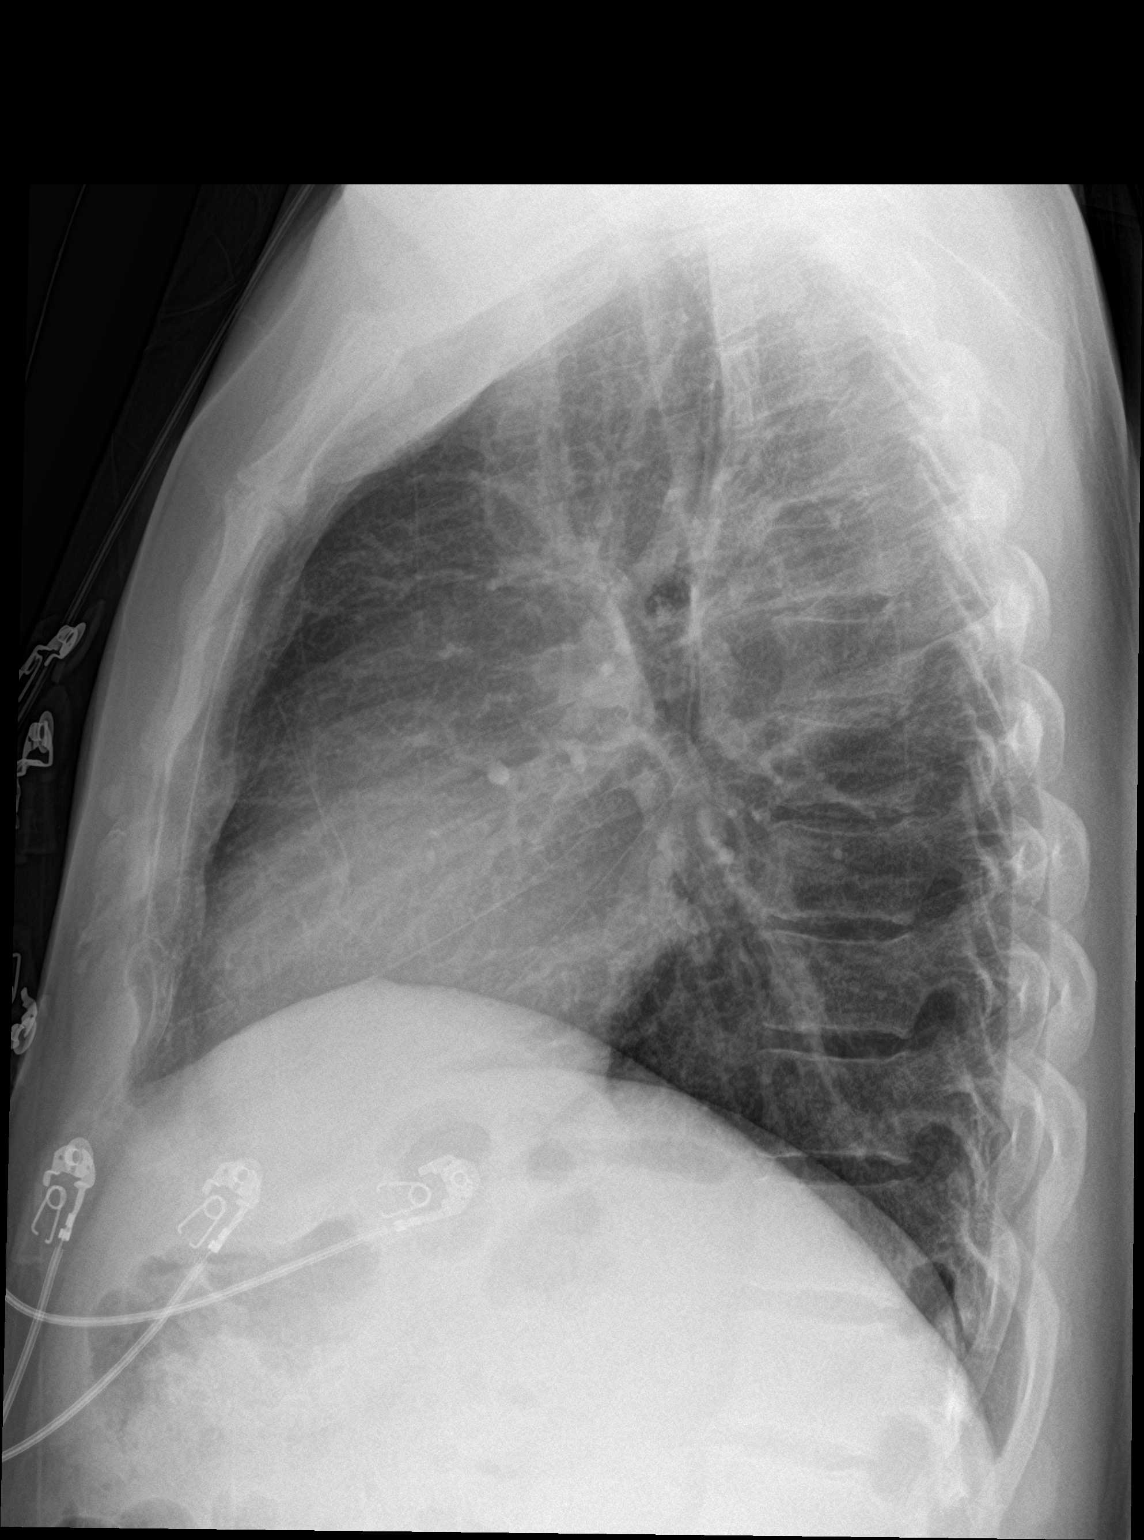

[2 of 2 positions shown; findings below may reference images not displayed]

FINDINGS: Stable heart size and mediastinal contours are within normal limits
given projection and technique. Both lungs are clear. The visualized
skeletal structures are unremarkable.
IMPRESSION: No acute pulmonary process identified.

## 2020-01-29 DIAGNOSIS — Z992 Dependence on renal dialysis: Secondary | ICD-10-CM | POA: Diagnosis not present

## 2020-01-29 DIAGNOSIS — N186 End stage renal disease: Secondary | ICD-10-CM | POA: Diagnosis not present

## 2020-01-29 DIAGNOSIS — N2581 Secondary hyperparathyroidism of renal origin: Secondary | ICD-10-CM | POA: Diagnosis not present

## 2020-01-31 DIAGNOSIS — Z992 Dependence on renal dialysis: Secondary | ICD-10-CM | POA: Diagnosis not present

## 2020-01-31 DIAGNOSIS — N186 End stage renal disease: Secondary | ICD-10-CM | POA: Diagnosis not present

## 2020-01-31 DIAGNOSIS — N2581 Secondary hyperparathyroidism of renal origin: Secondary | ICD-10-CM | POA: Diagnosis not present

## 2020-02-02 DIAGNOSIS — Z992 Dependence on renal dialysis: Secondary | ICD-10-CM | POA: Diagnosis not present

## 2020-02-02 DIAGNOSIS — N2581 Secondary hyperparathyroidism of renal origin: Secondary | ICD-10-CM | POA: Diagnosis not present

## 2020-02-02 DIAGNOSIS — N186 End stage renal disease: Secondary | ICD-10-CM | POA: Diagnosis not present

## 2020-02-05 DIAGNOSIS — Z992 Dependence on renal dialysis: Secondary | ICD-10-CM | POA: Diagnosis not present

## 2020-02-05 DIAGNOSIS — N2581 Secondary hyperparathyroidism of renal origin: Secondary | ICD-10-CM | POA: Diagnosis not present

## 2020-02-05 DIAGNOSIS — N186 End stage renal disease: Secondary | ICD-10-CM | POA: Diagnosis not present

## 2020-02-07 DIAGNOSIS — N186 End stage renal disease: Secondary | ICD-10-CM | POA: Diagnosis not present

## 2020-02-07 DIAGNOSIS — Z992 Dependence on renal dialysis: Secondary | ICD-10-CM | POA: Diagnosis not present

## 2020-02-07 DIAGNOSIS — N2581 Secondary hyperparathyroidism of renal origin: Secondary | ICD-10-CM | POA: Diagnosis not present

## 2020-02-09 DIAGNOSIS — N2581 Secondary hyperparathyroidism of renal origin: Secondary | ICD-10-CM | POA: Diagnosis not present

## 2020-02-09 DIAGNOSIS — Z992 Dependence on renal dialysis: Secondary | ICD-10-CM | POA: Diagnosis not present

## 2020-02-09 DIAGNOSIS — N186 End stage renal disease: Secondary | ICD-10-CM | POA: Diagnosis not present

## 2020-02-12 DIAGNOSIS — N186 End stage renal disease: Secondary | ICD-10-CM | POA: Diagnosis not present

## 2020-02-12 DIAGNOSIS — Z992 Dependence on renal dialysis: Secondary | ICD-10-CM | POA: Diagnosis not present

## 2020-02-12 DIAGNOSIS — N2581 Secondary hyperparathyroidism of renal origin: Secondary | ICD-10-CM | POA: Diagnosis not present

## 2020-02-14 DIAGNOSIS — N186 End stage renal disease: Secondary | ICD-10-CM | POA: Diagnosis not present

## 2020-02-14 DIAGNOSIS — N2581 Secondary hyperparathyroidism of renal origin: Secondary | ICD-10-CM | POA: Diagnosis not present

## 2020-02-14 DIAGNOSIS — Z992 Dependence on renal dialysis: Secondary | ICD-10-CM | POA: Diagnosis not present

## 2020-02-16 DIAGNOSIS — Z992 Dependence on renal dialysis: Secondary | ICD-10-CM | POA: Diagnosis not present

## 2020-02-16 DIAGNOSIS — N2581 Secondary hyperparathyroidism of renal origin: Secondary | ICD-10-CM | POA: Diagnosis not present

## 2020-02-16 DIAGNOSIS — N186 End stage renal disease: Secondary | ICD-10-CM | POA: Diagnosis not present

## 2020-02-19 DIAGNOSIS — N186 End stage renal disease: Secondary | ICD-10-CM | POA: Diagnosis not present

## 2020-02-19 DIAGNOSIS — Z992 Dependence on renal dialysis: Secondary | ICD-10-CM | POA: Diagnosis not present

## 2020-02-19 DIAGNOSIS — N2581 Secondary hyperparathyroidism of renal origin: Secondary | ICD-10-CM | POA: Diagnosis not present

## 2020-02-21 DIAGNOSIS — Z992 Dependence on renal dialysis: Secondary | ICD-10-CM | POA: Diagnosis not present

## 2020-02-21 DIAGNOSIS — N2581 Secondary hyperparathyroidism of renal origin: Secondary | ICD-10-CM | POA: Diagnosis not present

## 2020-02-21 DIAGNOSIS — N186 End stage renal disease: Secondary | ICD-10-CM | POA: Diagnosis not present

## 2020-02-23 DIAGNOSIS — N186 End stage renal disease: Secondary | ICD-10-CM | POA: Diagnosis not present

## 2020-02-23 DIAGNOSIS — Z992 Dependence on renal dialysis: Secondary | ICD-10-CM | POA: Diagnosis not present

## 2020-02-23 DIAGNOSIS — N2581 Secondary hyperparathyroidism of renal origin: Secondary | ICD-10-CM | POA: Diagnosis not present

## 2020-02-26 DIAGNOSIS — Z992 Dependence on renal dialysis: Secondary | ICD-10-CM | POA: Diagnosis not present

## 2020-02-26 DIAGNOSIS — N186 End stage renal disease: Secondary | ICD-10-CM | POA: Diagnosis not present

## 2020-02-26 DIAGNOSIS — N2581 Secondary hyperparathyroidism of renal origin: Secondary | ICD-10-CM | POA: Diagnosis not present

## 2020-02-26 NOTE — Progress Notes (Signed)
Cardiology Office Note:    Date:  02/27/2020   ID:  Lynnea Ferrier, DOB July 29, 1970, MRN [id number]  PCP:  Cleophas Dunker, DO  Cardiologist:  No primary care provider on file.  Electrophysiologist:  None   Referring MD: Cleophas Dunker, *   Chief Complaint  Patient presents with  . Follow-up    Chest pain  . Chest Pain    History of Present Illness:    Doye Geraci is a 50 y.o. male with a hx of ESRD, hypertension, hyperlipidemia, atrial fibrillation who is referred by Dr. Sandi Carne for evaluation of chest pain.  He previously followed in the A. fib clinic, last seen 12/2017 after found to be in A. fib after dialysis.  CHA2DS2-VASc score 1, he was started on aspirin only.  Echocardiogram on 01/31/2018 showed LVEF 55 to 60%, normal RV function, grade 2 diastolic dysfunction, no significant valvular disease.  He reports chest pain started 2 months ago.  Describes left-sided sharp pain that radiates to his left shoulder.  Occurs every 1 to 2 days.  Occurs at rest, lasted about 1 minute and resolves.  Seems improved with massaging the area.  Has not noted relationship with exertion.  He denies any dyspnea, lightheadedness, or lower extremity edema.  Reports had an episode of palpitations 3 days ago where he felt like his heart was racing.  Lasted about 1 minute.  No smoking history.  No family history of heart disease in his immediate family.  Past Medical History:  Diagnosis Date  . Chronic kidney disease   . Constipation   . GERD (gastroesophageal reflux disease)   . Gout   . Hypercholesteremia   . Hypertension   . Renal failure    on dialysis M/W/F    Past Surgical History:  Procedure Laterality Date  . AV FISTULA PLACEMENT  10/05/2011   Procedure: ARTERIOVENOUS (AV) FISTULA CREATION;  Surgeon: Rosetta Posner, MD;  Location: [facility];  Service: Vascular;  Laterality: Left;  . CARDIAC CATHETERIZATION N/A 10/23/2014   Procedure: Right/Left Heart Cath and  Coronary Angiography;  Surgeon: Belva Crome, MD;  Location: [facility];  Service: Cardiovascular;  Laterality: N/A;  . FISTULA SUPERFICIALIZATION Left 01/09/2020   Procedure: LEFT ARM FISTULA PLICATION;  Surgeon: Waynetta Sandy, MD;  Location: [facility];  Service: Vascular;  Laterality: Left;  . INSERTION OF DIALYSIS CATHETER  10/05/2011   Procedure: INSERTION OF DIALYSIS CATHETER;  Surgeon: Rosetta Posner, MD;  Location: [facility];  Service: Vascular;  Laterality: N/A;  right internal jugular vein  . IR AV DIALY SHUNT INTRO NEEDLE/INTRACATH INITIAL W/PTA/IMG LEFT  07/26/2018  . IR DIALY SHUNT INTRO NEEDLE/INTRACATH INITIAL W/IMG LEFT Left 11/25/2017  . IR DIALY SHUNT INTRO NEEDLE/INTRACATH INITIAL W/IMG LEFT Left 11/22/2018  . IR US GUIDE VASC ACCESS LEFT  07/26/2018  . NO PAST SURGERIES    . REVISON OF ARTERIOVENOUS FISTULA Left [id number]   Procedure: PLICATION OF LEFT RADIOCEPHALIC ARTERIOVENOUS FISTULA -PSEUDOANEURYSM TIMES TWO;  Surgeon: Conrad Wanamie, MD;  Location[facility];  Service: Vascular;  Laterality: Left;    Current Medications: Current Meds  Medication Sig  . allopurinol (ZYLOPRIM) 100 MG tablet Take 200 mg by mouth daily.  Marland Kitchen aspirin 81 MG EC tablet Take 81 mg by mouth daily.  . calcium elemental as carbonate (BARIATRIC TUMS ULTRA) 400 MG chewable tablet Chew 800 mg by mouth 3 (three) times daily with meals.   . carvedilol (COREG) 6.25 MG tablet Take 1 tablet (  6.25 mg total) by mouth as directed. As needed- for breakthrough afib (Patient taking differently: Take 6.25 mg by mouth 2 (two) times a day.)  . clonazePAM (KLONOPIN) 2 MG tablet Take 2 mg by mouth daily as needed (cramps).   . multivitamin (RENA-VIT) TABS tablet Take 1 tablet by mouth at bedtime.  . simvastatin (ZOCOR) 20 MG tablet Take 20 mg by mouth at bedtime.      Allergies:   Bactrim [sulfamethoxazole-trimethoprim]   Social History   Socioeconomic History  . Marital status: Single    Spouse name: Not on  file  . Number of children: Not on file  . Years of education: Not on file  . Highest education level: Not on file  Occupational History  . Not on file  Tobacco Use  . Smoking status: Never Smoker  . Smokeless tobacco: Never Used  Substance and Sexual Activity  . Alcohol use: No    Alcohol/week: 0.0 standard drinks  . Drug use: No  . Sexual activity: Yes    Birth control/protection: None  Other Topics Concern  . Not on file  Social History Narrative  . Not on file   Social Determinants of Health   Financial Resource Strain: Not on file  Food Insecurity: Not on file  Transportation Needs: Not on file  Physical Activity: Not on file  Stress: Not on file  Social Connections: Not on file     Family History: The patient's family history includes Hypertension in his mother; Lung cancer in his father.  ROS:   Please see the history of present illness.     All other systems reviewed and are negative.  EKGs/Labs/Other Studies Reviewed:    The following studies were reviewed today:   EKG:  EKG is ordered today.  The ekg ordered today demonstrates normal sinus rhythm, rate 71, no ST/T abnormalities  Recent Labs: 01/09/2020: BUN 50; Creatinine, Ser 10.00; Hemoglobin 13.3; Potassium 4.9; Sodium 135  Recent Lipid Panel No results found for: CHOL, TRIG, HDL, CHOLHDL, VLDL, LDLCALC, LDLDIRECT  Physical Exam:    VS:  BP 125/85 (BP Location: Right Arm, Patient Position: Sitting)   Pulse 70   Ht 5' (1.524 m)   Wt 148 lb (67.1 kg)   SpO2 99%   BMI 28.90 kg/m     Wt Readings from Last 3 Encounters:  02/27/20 148 lb (67.1 kg)  01/09/20 143 lb 4.8 oz (65 kg)  01/04/20 141 lb 8 oz (64.2 kg)     GEN:  in no acute distress HEENT: Normal NECK: No JVD; No carotid bruits LYMPHATICS: No lymphadenopathy CARDIAC: RRR, no murmurs, rubs, gallops RESPIRATORY:  Clear to auscultation without rales, wheezing or rhonchi  ABDOMEN: Soft, non-tender, non-distended MUSCULOSKELETAL:  No  edema; No deformity  SKIN: Warm and dry NEUROLOGIC:  Alert and oriented x 3 PSYCHIATRIC:  Normal affect   ASSESSMENT:    1. Chest pain of uncertain etiology   2. Atrial fibrillation, unspecified type ([place])   3. Essential hypertension   4. Hyperlipidemia, unspecified hyperlipidemia type    PLAN:    Chest pain: Atypical in description but does have cardiac risk factors (ESRD, hypertension, hyperlipidemia).  Will evaluate for ischemia with Lexiscan Myoview.  Check echocardiogram to rule out structural heart disease.  Atrial fibrillation: CHA2DS2-VASc score 1 (hypertension).  Previously followed in A. fib clinic, last seen in 2019 -Continue aspirin 81 mg daily -Continue carvedilol 625 mg twice daily -Reports recent episode of palpitations were felt like heart was racing.  Will evaluate with Zio patch x2 weeks  Hyperlipidemia: On simvastatin 20 mg daily  Hypertension: On carvedilol 625 mg twice daily.  Appears controlled.  ESRD: On MWF HD  RTC in 3 months   Shared Decision Making/Informed Consent The risks [chest pain, shortness of breath, cardiac arrhythmias, dizziness, blood pressure fluctuations, myocardial infarction, stroke/transient ischemic attack, nausea, vomiting, allergic reaction, radiation exposure, metallic taste sensation and life-threatening complications (estimated to be 1 in 10,000)], benefits (risk stratification, diagnosing coronary artery disease, treatment guidance) and alternatives of a nuclear stress test were discussed in detail with Mr. Spindel and he agrees to proceed.    Medication Adjustments/Labs and Tests Ordered: Current medicines are reviewed at length with the patient today.  Concerns regarding medicines are outlined above.  Orders Placed This Encounter  Procedures  . MYOCARDIAL PERFUSION IMAGING  . LONG TERM MONITOR (3-14 DAYS)  . EKG 12-Lead  . ECHOCARDIOGRAM COMPLETE   No orders of the defined types were placed in this  encounter.   Patient Instructions  Medication Instructions:  Your physician recommends that you continue on your current medications as directed. Please refer to the Current Medication list given to you today.  Testing/Procedures: Your physician has requested that you have an echocardiogram. Echocardiography is a painless test that uses sound waves to create images of your heart. It provides your doctor with information about the size and shape of your heart and how well your heart's chambers and valves are working. This procedure takes approximately one hour. There are no restrictions for this procedure. This will be done at our [facility] location:  [place] has requested that you have a lexiscan myoview. For further information please visit [email or web address]. Please follow instruction sheet, as given. This will take place at [place], [address]  How to prepare for your Myocardial Perfusion Test:  Do not eat or drink 3 hours prior to your test, except you may have water.  Do not consume products containing caffeine (regular or decaffeinated) 12 hours prior to your test. (ex: coffee, chocolate, sodas, tea).  Do bring a list of your current medications with you.  If not listed below, you may take your medications as normal.  Do wear comfortable clothes (no dresses or overalls) and walking shoes, tennis shoes preferred (No heels or open toe shoes are allowed).  Do NOT wear cologne, perfume, aftershave, or lotions (deodorant is allowed).  The test will take approximately 3 to 4 hours to complete  If these instructions are not followed, your test will have to be rescheduled.    ZIO XT- Long Term Monitor Instructions   Your physician has requested you wear your ZIO patch monitor 14 days.   This is a single patch monitor.  [employer] supplies one patch monitor per enrollment.  Additional stickers are not available.   Please do not  apply patch if you will be having a Nuclear Stress Test, Echocardiogram, Cardiac CT, MRI, or Chest Xray during the time frame you would be wearing the monitor. The patch cannot be worn during these tests.  You cannot remove and re-apply the ZIO XT patch monitor.   Your ZIO patch monitor will be sent USPS Priority mail from [facility] directly to your home address. The monitor may also be mailed to a PO BOX if home delivery is not available.   It may take 3-5 days to receive your monitor after you have been enrolled.   Once you have  received you monitor, please review enclosed instructions.  Your monitor has already been registered assigning a specific monitor serial # to you.   Applying the monitor   Shave hair from upper left chest.   Hold abrader disc by orange tab.  Rub abrader in 40 strokes over left upper chest as indicated in your monitor instructions.   Clean area with 4 enclosed alcohol pads .  Use all pads to assure are is cleaned thoroughly.  Let dry.   Apply patch as indicated in monitor instructions.  Patch will be place under collarbone on left side of chest with arrow pointing upward.   Rub patch adhesive wings for 2 minutes.Remove white label marked "1".  Remove white label marked "2".  Rub patch adhesive wings for 2 additional minutes.   While looking in a mirror, press and release button in center of patch.  A small green light will flash 3-4 times .  This will be your only indicator the monitor has been turned on.     Do not shower for the first 24 hours.  You may shower after the first 24 hours.   Press button if you feel a symptom. You will hear a small click.  Record Date, Time and Symptom in the Patient Log Book.   When you are ready to remove patch, follow instructions on last 2 pages of Patient Log Book.  Stick patch monitor onto last page of Patient Log Book.   Place Patient Log Book in [place] box.  Use locking tab on box and tape box closed securely.  The  [employer] has [employer] on it.  Please place in mailbox as soon as possible.  Your physician should have your test results approximately 7 days after the monitor has been mailed back to [facility].   Call [place] at [phone] if you have questions regarding your ZIO XT patch monitor.  Call them immediately if you see an orange light blinking on your monitor.   If your monitor falls off in less than 4 days contact our Monitor department at [phone].  If your monitor becomes loose or falls off after 4 days call [employer] at [phone] for suggestions on securing your monitor.    Follow-Up: At [facility], you and your health needs are our priority.  As part of our continuing mission to provide you with exceptional heart care, we have created designated Provider Care Teams.  These Care Teams include your primary Cardiologist (physician) and Advanced Practice Providers (APPs -  Physician Assistants and Nurse Practitioners) who all work together to provide you with the care you need, when you need it.  We recommend signing up for the patient portal called "MyChart".  Sign up information is provided on this After Visit Summary.  MyChart is used to connect with patients for Virtual Visits (Telemedicine).  Patients are able to view lab/test results, encounter notes, upcoming appointments, etc.  Non-urgent messages can be sent to your provider as well.   To learn more about what you can do with MyChart, go to [email or web address].    Your next appointment:   3 month(s)  The format for your next appointment:   In Person  Provider:   Oswaldo Milian, MD       Signed, Donato Heinz, MD  02/27/2020 11:17 AM    [place]

## 2020-02-27 ENCOUNTER — Other Ambulatory Visit: Payer: Self-pay

## 2020-02-27 ENCOUNTER — Encounter: Payer: Self-pay | Admitting: Cardiology

## 2020-02-27 ENCOUNTER — Ambulatory Visit (INDEPENDENT_AMBULATORY_CARE_PROVIDER_SITE_OTHER): Payer: BC Managed Care – PPO

## 2020-02-27 ENCOUNTER — Encounter: Payer: Self-pay | Admitting: *Deleted

## 2020-02-27 ENCOUNTER — Ambulatory Visit (INDEPENDENT_AMBULATORY_CARE_PROVIDER_SITE_OTHER): Payer: BC Managed Care – PPO | Admitting: Cardiology

## 2020-02-27 VITALS — BP 125/85 | HR 70 | Ht 60.0 in | Wt 148.0 lb

## 2020-02-27 DIAGNOSIS — E785 Hyperlipidemia, unspecified: Secondary | ICD-10-CM

## 2020-02-27 DIAGNOSIS — I10 Essential (primary) hypertension: Secondary | ICD-10-CM

## 2020-02-27 DIAGNOSIS — I4891 Unspecified atrial fibrillation: Secondary | ICD-10-CM | POA: Diagnosis not present

## 2020-02-27 DIAGNOSIS — R079 Chest pain, unspecified: Secondary | ICD-10-CM

## 2020-02-27 NOTE — Patient Instructions (Signed)
Medication Instructions:  Your physician recommends that you continue on your current medications as directed. Please refer to the Current Medication list given to you today.  Testing/Procedures: Your physician has requested that you have an echocardiogram. Echocardiography is a painless test that uses sound waves to create images of your heart. It provides your doctor with information about the size and shape of your heart and how well your heart's chambers and valves are working. This procedure takes approximately one hour. There are no restrictions for this procedure. This will be done at our [facility] location:  [place] has requested that you have a lexiscan myoview. For further information please visit [email or web address]. Please follow instruction sheet, as given. This will take place at [place], [address]  How to prepare for your Myocardial Perfusion Test:  Do not eat or drink 3 hours prior to your test, except you may have water.  Do not consume products containing caffeine (regular or decaffeinated) 12 hours prior to your test. (ex: coffee, chocolate, sodas, tea).  Do bring a list of your current medications with you.  If not listed below, you may take your medications as normal.  Do wear comfortable clothes (no dresses or overalls) and walking shoes, tennis shoes preferred (No heels or open toe shoes are allowed).  Do NOT wear cologne, perfume, aftershave, or lotions (deodorant is allowed).  The test will take approximately 3 to 4 hours to complete  If these instructions are not followed, your test will have to be rescheduled.    ZIO XT- Long Term Monitor Instructions   Your physician has requested you wear your ZIO patch monitor 14 days.   This is a single patch monitor.  [employer] supplies one patch monitor per enrollment.  Additional stickers are not available.   Please do not apply patch if you will be having a Nuclear  Stress Test, Echocardiogram, Cardiac CT, MRI, or Chest Xray during the time frame you would be wearing the monitor. The patch cannot be worn during these tests.  You cannot remove and re-apply the ZIO XT patch monitor.   Your ZIO patch monitor will be sent USPS Priority mail from [facility] directly to your home address. The monitor may also be mailed to a PO BOX if home delivery is not available.   It may take 3-5 days to receive your monitor after you have been enrolled.   Once you have received you monitor, please review enclosed instructions.  Your monitor has already been registered assigning a specific monitor serial # to you.   Applying the monitor   Shave hair from upper left chest.   Hold abrader disc by orange tab.  Rub abrader in 40 strokes over left upper chest as indicated in your monitor instructions.   Clean area with 4 enclosed alcohol pads .  Use all pads to assure are is cleaned thoroughly.  Let dry.   Apply patch as indicated in monitor instructions.  Patch will be place under collarbone on left side of chest with arrow pointing upward.   Rub patch adhesive wings for 2 minutes.Remove white label marked "1".  Remove white label marked "2".  Rub patch adhesive wings for 2 additional minutes.   While looking in a mirror, press and release button in center of patch.  A small green light will flash 3-4 times .  This will be your only indicator the monitor has been turned on.  Do not shower for the first 24 hours.  You may shower after the first 24 hours.   Press button if you feel a symptom. You will hear a small click.  Record Date, Time and Symptom in the Patient Log Book.   When you are ready to remove patch, follow instructions on last 2 pages of Patient Log Book.  Stick patch monitor onto last page of Patient Log Book.   Place Patient Log Book in [place] box.  Use locking tab on box and tape box closed securely.  The [employer] has [employer] on  it.  Please place in mailbox as soon as possible.  Your physician should have your test results approximately 7 days after the monitor has been mailed back to [facility].   Call [place] at [phone] if you have questions regarding your ZIO XT patch monitor.  Call them immediately if you see an orange light blinking on your monitor.   If your monitor falls off in less than 4 days contact our Monitor department at [phone].  If your monitor becomes loose or falls off after 4 days call [employer] at [phone] for suggestions on securing your monitor.    Follow-Up: At [facility], you and your health needs are our priority.  As part of our continuing mission to provide you with exceptional heart care, we have created designated Provider Care Teams.  These Care Teams include your primary Cardiologist (physician) and Advanced Practice Providers (APPs -  Physician Assistants and Nurse Practitioners) who all work together to provide you with the care you need, when you need it.  We recommend signing up for the patient portal called "MyChart".  Sign up information is provided on this After Visit Summary.  MyChart is used to connect with patients for Virtual Visits (Telemedicine).  Patients are able to view lab/test results, encounter notes, upcoming appointments, etc.  Non-urgent messages can be sent to your provider as well.   To learn more about what you can do with MyChart, go to [email or web address].    Your next appointment:   3 month(s)  The format for your next appointment:   In Person  Provider:   Oswaldo Milian, MD

## 2020-02-27 NOTE — Progress Notes (Signed)
Patient ID: Cole [name], male   DOB: Jun 18, 1970, 50 y.o.   MRN: [id number] Patient enrolled for [employer] to ship a 14 day ZIO XT long term holter monitor to his home.  Spanish instructions requested.

## 2020-02-28 DIAGNOSIS — N2581 Secondary hyperparathyroidism of renal origin: Secondary | ICD-10-CM | POA: Diagnosis not present

## 2020-02-28 DIAGNOSIS — Z992 Dependence on renal dialysis: Secondary | ICD-10-CM | POA: Diagnosis not present

## 2020-02-28 DIAGNOSIS — N186 End stage renal disease: Secondary | ICD-10-CM | POA: Diagnosis not present

## 2020-03-01 DIAGNOSIS — N186 End stage renal disease: Secondary | ICD-10-CM | POA: Diagnosis not present

## 2020-03-01 DIAGNOSIS — Z992 Dependence on renal dialysis: Secondary | ICD-10-CM | POA: Diagnosis not present

## 2020-03-01 DIAGNOSIS — N2581 Secondary hyperparathyroidism of renal origin: Secondary | ICD-10-CM | POA: Diagnosis not present

## 2020-03-04 DIAGNOSIS — N2581 Secondary hyperparathyroidism of renal origin: Secondary | ICD-10-CM | POA: Diagnosis not present

## 2020-03-04 DIAGNOSIS — Z992 Dependence on renal dialysis: Secondary | ICD-10-CM | POA: Diagnosis not present

## 2020-03-04 DIAGNOSIS — N186 End stage renal disease: Secondary | ICD-10-CM | POA: Diagnosis not present

## 2020-03-06 DIAGNOSIS — Z992 Dependence on renal dialysis: Secondary | ICD-10-CM | POA: Diagnosis not present

## 2020-03-06 DIAGNOSIS — N186 End stage renal disease: Secondary | ICD-10-CM | POA: Diagnosis not present

## 2020-03-06 DIAGNOSIS — N2581 Secondary hyperparathyroidism of renal origin: Secondary | ICD-10-CM | POA: Diagnosis not present

## 2020-03-08 DIAGNOSIS — N2581 Secondary hyperparathyroidism of renal origin: Secondary | ICD-10-CM | POA: Diagnosis not present

## 2020-03-08 DIAGNOSIS — Z992 Dependence on renal dialysis: Secondary | ICD-10-CM | POA: Diagnosis not present

## 2020-03-08 DIAGNOSIS — N186 End stage renal disease: Secondary | ICD-10-CM | POA: Diagnosis not present

## 2020-03-11 DIAGNOSIS — N2581 Secondary hyperparathyroidism of renal origin: Secondary | ICD-10-CM | POA: Diagnosis not present

## 2020-03-11 DIAGNOSIS — N186 End stage renal disease: Secondary | ICD-10-CM | POA: Diagnosis not present

## 2020-03-11 DIAGNOSIS — Z992 Dependence on renal dialysis: Secondary | ICD-10-CM | POA: Diagnosis not present

## 2020-03-13 ENCOUNTER — Telehealth (HOSPITAL_COMMUNITY): Payer: Self-pay | Admitting: *Deleted

## 2020-03-13 DIAGNOSIS — N2581 Secondary hyperparathyroidism of renal origin: Secondary | ICD-10-CM | POA: Diagnosis not present

## 2020-03-13 DIAGNOSIS — N186 End stage renal disease: Secondary | ICD-10-CM | POA: Diagnosis not present

## 2020-03-13 DIAGNOSIS — Z992 Dependence on renal dialysis: Secondary | ICD-10-CM | POA: Diagnosis not present

## 2020-03-13 NOTE — Telephone Encounter (Signed)
Close encounter 

## 2020-03-14 ENCOUNTER — Other Ambulatory Visit: Payer: Self-pay

## 2020-03-14 ENCOUNTER — Telehealth: Payer: Self-pay | Admitting: *Deleted

## 2020-03-14 ENCOUNTER — Ambulatory Visit (HOSPITAL_COMMUNITY)
Admission: RE | Admit: 2020-03-14 | Discharge: 2020-03-14 | Disposition: A | Discharge status: Home / Self Care | Payer: BC Managed Care – PPO | Source: Ambulatory Visit | Attending: Cardiology | Admitting: Cardiology

## 2020-03-14 ENCOUNTER — Encounter (HOSPITAL_COMMUNITY): Payer: Self-pay | Admitting: *Deleted

## 2020-03-14 DIAGNOSIS — R079 Chest pain, unspecified: Secondary | ICD-10-CM | POA: Diagnosis not present

## 2020-03-14 LAB — MYOCARDIAL PERFUSION IMAGING
LV dias vol: 131 mL (ref 62–150)
LV sys vol: 71 mL
Peak HR: 106 {beats}/min
Rest HR: 64 {beats}/min
SDS: 1
SRS: 0
SSS: 1
TID: 1.08

## 2020-03-14 MED ORDER — AMINOPHYLLINE 25 MG/ML IV SOLN
75.0000 mg | Freq: Once | INTRAVENOUS | Status: AC
  Administered 2020-03-14: 75 mg via INTRAVENOUS

## 2020-03-14 MED ORDER — TECHNETIUM TC 99M TETROFOSMIN IV KIT
10.4000 | PACK | Freq: Once | INTRAVENOUS | Status: AC | PRN
  Administered 2020-03-14: 10.4 via INTRAVENOUS
  Filled 2020-03-14: qty 11

## 2020-03-14 MED ORDER — TECHNETIUM TC 99M TETROFOSMIN IV KIT
31.8000 | PACK | Freq: Once | INTRAVENOUS | Status: AC | PRN
  Administered 2020-03-14: 31.8 via INTRAVENOUS
  Filled 2020-03-14: qty 32

## 2020-03-14 MED ORDER — REGADENOSON 0.4 MG/5ML IV SOLN
0.4000 mg | Freq: Once | INTRAVENOUS | Status: AC
  Administered 2020-03-14: 0.4 mg via INTRAVENOUS

## 2020-03-14 NOTE — Telephone Encounter (Signed)
Patient told nuclear medicine staff today he had not received his monitor. According to [employer] tracking his monitor was delivered 03/01/2020. [employer] notified.  They will contact patient with spanish speaking associate to verify address on record is correct, confirm package not delivered to doorstep,  and ship out another monitor if necessary.

## 2020-03-14 NOTE — Progress Notes (Signed)
Jenna Luo, CAP interpreter assisted with interpretation services for a Nuc MPI study done today, Mar 14, 2020.

## 2020-03-15 DIAGNOSIS — N186 End stage renal disease: Secondary | ICD-10-CM | POA: Diagnosis not present

## 2020-03-15 DIAGNOSIS — Z992 Dependence on renal dialysis: Secondary | ICD-10-CM | POA: Diagnosis not present

## 2020-03-15 DIAGNOSIS — N2581 Secondary hyperparathyroidism of renal origin: Secondary | ICD-10-CM | POA: Diagnosis not present

## 2020-03-18 DIAGNOSIS — Z992 Dependence on renal dialysis: Secondary | ICD-10-CM | POA: Diagnosis not present

## 2020-03-18 DIAGNOSIS — N2581 Secondary hyperparathyroidism of renal origin: Secondary | ICD-10-CM | POA: Diagnosis not present

## 2020-03-18 DIAGNOSIS — N186 End stage renal disease: Secondary | ICD-10-CM | POA: Diagnosis not present

## 2020-03-20 DIAGNOSIS — N186 End stage renal disease: Secondary | ICD-10-CM | POA: Diagnosis not present

## 2020-03-20 DIAGNOSIS — Z992 Dependence on renal dialysis: Secondary | ICD-10-CM | POA: Diagnosis not present

## 2020-03-20 DIAGNOSIS — N2581 Secondary hyperparathyroidism of renal origin: Secondary | ICD-10-CM | POA: Diagnosis not present

## 2020-03-21 ENCOUNTER — Ambulatory Visit (HOSPITAL_COMMUNITY): Payer: BC Managed Care – PPO | Attending: Cardiology

## 2020-03-21 ENCOUNTER — Other Ambulatory Visit: Payer: Self-pay

## 2020-03-21 DIAGNOSIS — R079 Chest pain, unspecified: Secondary | ICD-10-CM

## 2020-03-21 LAB — ECHOCARDIOGRAM COMPLETE
Area-P 1/2: 2.76 cm2
S' Lateral: 3.3 cm

## 2020-03-22 DIAGNOSIS — Z992 Dependence on renal dialysis: Secondary | ICD-10-CM | POA: Diagnosis not present

## 2020-03-22 DIAGNOSIS — N186 End stage renal disease: Secondary | ICD-10-CM | POA: Diagnosis not present

## 2020-03-22 DIAGNOSIS — N2581 Secondary hyperparathyroidism of renal origin: Secondary | ICD-10-CM | POA: Diagnosis not present

## 2020-03-25 DIAGNOSIS — N2581 Secondary hyperparathyroidism of renal origin: Secondary | ICD-10-CM | POA: Diagnosis not present

## 2020-03-25 DIAGNOSIS — N186 End stage renal disease: Secondary | ICD-10-CM | POA: Diagnosis not present

## 2020-03-25 DIAGNOSIS — Z992 Dependence on renal dialysis: Secondary | ICD-10-CM | POA: Diagnosis not present

## 2020-03-27 DIAGNOSIS — N186 End stage renal disease: Secondary | ICD-10-CM | POA: Diagnosis not present

## 2020-03-27 DIAGNOSIS — Z992 Dependence on renal dialysis: Secondary | ICD-10-CM | POA: Diagnosis not present

## 2020-03-27 DIAGNOSIS — N2581 Secondary hyperparathyroidism of renal origin: Secondary | ICD-10-CM | POA: Diagnosis not present

## 2020-03-29 ENCOUNTER — Telehealth: Payer: Self-pay | Admitting: Cardiology

## 2020-03-29 DIAGNOSIS — N186 End stage renal disease: Secondary | ICD-10-CM | POA: Diagnosis not present

## 2020-03-29 DIAGNOSIS — Z992 Dependence on renal dialysis: Secondary | ICD-10-CM | POA: Diagnosis not present

## 2020-03-29 DIAGNOSIS — N2581 Secondary hyperparathyroidism of renal origin: Secondary | ICD-10-CM | POA: Diagnosis not present

## 2020-03-29 NOTE — Telephone Encounter (Signed)
Returned call to patient by [place] interpreter. Verified name and birth date of patient. Advised patient of Echo and Stress test results as follows.   Donato Heinz, MD  03/25/2020 6:40 AM EST      No significant abnormality    Donato Heinz, MD  03/17/2020 10:23 PM EST      No evidence of blockages in heart arteries but heart pumping function is mildly reduced. Will follow-up echocardiogram   Patient aware and verbalized understanding of all results. Patient asked if he should still wear heart monitor. Advised patient to wear monitor for 14 days and then return to company. Patient asked if he should still take all of his medications, advised patient that we were not stopping any medications and he should continue as directed by his doctors. Patient has follow up with Dr. Gardiner Rhyme on 05/28/20. Advised patient to call back to office with any issues, or concerns. Patient verbalized understanding.

## 2020-03-29 NOTE — Telephone Encounter (Signed)
Patient returning call for echo and stress test results. Patient needs a Patent attorney.

## 2020-04-01 DIAGNOSIS — N2581 Secondary hyperparathyroidism of renal origin: Secondary | ICD-10-CM | POA: Diagnosis not present

## 2020-04-01 DIAGNOSIS — N186 End stage renal disease: Secondary | ICD-10-CM | POA: Diagnosis not present

## 2020-04-01 DIAGNOSIS — Z992 Dependence on renal dialysis: Secondary | ICD-10-CM | POA: Diagnosis not present

## 2020-04-03 DIAGNOSIS — N186 End stage renal disease: Secondary | ICD-10-CM | POA: Diagnosis not present

## 2020-04-03 DIAGNOSIS — Z992 Dependence on renal dialysis: Secondary | ICD-10-CM | POA: Diagnosis not present

## 2020-04-03 DIAGNOSIS — N2581 Secondary hyperparathyroidism of renal origin: Secondary | ICD-10-CM | POA: Diagnosis not present

## 2020-04-05 DIAGNOSIS — N186 End stage renal disease: Secondary | ICD-10-CM | POA: Diagnosis not present

## 2020-04-05 DIAGNOSIS — Z992 Dependence on renal dialysis: Secondary | ICD-10-CM | POA: Diagnosis not present

## 2020-04-05 DIAGNOSIS — N2581 Secondary hyperparathyroidism of renal origin: Secondary | ICD-10-CM | POA: Diagnosis not present

## 2020-04-08 DIAGNOSIS — N2581 Secondary hyperparathyroidism of renal origin: Secondary | ICD-10-CM | POA: Diagnosis not present

## 2020-04-08 DIAGNOSIS — N186 End stage renal disease: Secondary | ICD-10-CM | POA: Diagnosis not present

## 2020-04-08 DIAGNOSIS — Z992 Dependence on renal dialysis: Secondary | ICD-10-CM | POA: Diagnosis not present

## 2020-04-10 DIAGNOSIS — N2581 Secondary hyperparathyroidism of renal origin: Secondary | ICD-10-CM | POA: Diagnosis not present

## 2020-04-10 DIAGNOSIS — Z992 Dependence on renal dialysis: Secondary | ICD-10-CM | POA: Diagnosis not present

## 2020-04-10 DIAGNOSIS — N186 End stage renal disease: Secondary | ICD-10-CM | POA: Diagnosis not present

## 2020-04-12 DIAGNOSIS — Z992 Dependence on renal dialysis: Secondary | ICD-10-CM | POA: Diagnosis not present

## 2020-04-12 DIAGNOSIS — N186 End stage renal disease: Secondary | ICD-10-CM | POA: Diagnosis not present

## 2020-04-12 DIAGNOSIS — N2581 Secondary hyperparathyroidism of renal origin: Secondary | ICD-10-CM | POA: Diagnosis not present

## 2020-04-15 DIAGNOSIS — N2581 Secondary hyperparathyroidism of renal origin: Secondary | ICD-10-CM | POA: Diagnosis not present

## 2020-04-15 DIAGNOSIS — Z992 Dependence on renal dialysis: Secondary | ICD-10-CM | POA: Diagnosis not present

## 2020-04-15 DIAGNOSIS — N186 End stage renal disease: Secondary | ICD-10-CM | POA: Diagnosis not present

## 2020-04-17 DIAGNOSIS — Z992 Dependence on renal dialysis: Secondary | ICD-10-CM | POA: Diagnosis not present

## 2020-04-17 DIAGNOSIS — N186 End stage renal disease: Secondary | ICD-10-CM | POA: Diagnosis not present

## 2020-04-17 DIAGNOSIS — N2581 Secondary hyperparathyroidism of renal origin: Secondary | ICD-10-CM | POA: Diagnosis not present

## 2020-04-19 DIAGNOSIS — Z992 Dependence on renal dialysis: Secondary | ICD-10-CM | POA: Diagnosis not present

## 2020-04-19 DIAGNOSIS — N186 End stage renal disease: Secondary | ICD-10-CM | POA: Diagnosis not present

## 2020-04-19 DIAGNOSIS — N2581 Secondary hyperparathyroidism of renal origin: Secondary | ICD-10-CM | POA: Diagnosis not present

## 2020-04-22 DIAGNOSIS — N2581 Secondary hyperparathyroidism of renal origin: Secondary | ICD-10-CM | POA: Diagnosis not present

## 2020-04-22 DIAGNOSIS — N186 End stage renal disease: Secondary | ICD-10-CM | POA: Diagnosis not present

## 2020-04-22 DIAGNOSIS — Z992 Dependence on renal dialysis: Secondary | ICD-10-CM | POA: Diagnosis not present

## 2020-04-24 DIAGNOSIS — Z992 Dependence on renal dialysis: Secondary | ICD-10-CM | POA: Diagnosis not present

## 2020-04-24 DIAGNOSIS — N186 End stage renal disease: Secondary | ICD-10-CM | POA: Diagnosis not present

## 2020-04-24 DIAGNOSIS — N2581 Secondary hyperparathyroidism of renal origin: Secondary | ICD-10-CM | POA: Diagnosis not present

## 2020-04-25 DIAGNOSIS — Z01818 Encounter for other preprocedural examination: Secondary | ICD-10-CM | POA: Diagnosis not present

## 2020-04-25 DIAGNOSIS — I120 Hypertensive chronic kidney disease with stage 5 chronic kidney disease or end stage renal disease: Secondary | ICD-10-CM | POA: Diagnosis not present

## 2020-04-25 DIAGNOSIS — Z992 Dependence on renal dialysis: Secondary | ICD-10-CM | POA: Diagnosis not present

## 2020-04-25 DIAGNOSIS — N186 End stage renal disease: Secondary | ICD-10-CM | POA: Diagnosis not present

## 2020-04-25 DIAGNOSIS — I4891 Unspecified atrial fibrillation: Secondary | ICD-10-CM | POA: Diagnosis not present

## 2020-04-25 DIAGNOSIS — N2889 Other specified disorders of kidney and ureter: Secondary | ICD-10-CM | POA: Diagnosis not present

## 2020-04-26 DIAGNOSIS — N186 End stage renal disease: Secondary | ICD-10-CM | POA: Diagnosis not present

## 2020-04-26 DIAGNOSIS — N2581 Secondary hyperparathyroidism of renal origin: Secondary | ICD-10-CM | POA: Diagnosis not present

## 2020-04-26 DIAGNOSIS — Z992 Dependence on renal dialysis: Secondary | ICD-10-CM | POA: Diagnosis not present

## 2020-04-29 DIAGNOSIS — N186 End stage renal disease: Secondary | ICD-10-CM | POA: Diagnosis not present

## 2020-04-29 DIAGNOSIS — Z992 Dependence on renal dialysis: Secondary | ICD-10-CM | POA: Diagnosis not present

## 2020-04-29 DIAGNOSIS — N2581 Secondary hyperparathyroidism of renal origin: Secondary | ICD-10-CM | POA: Diagnosis not present

## 2020-05-01 DIAGNOSIS — N2581 Secondary hyperparathyroidism of renal origin: Secondary | ICD-10-CM | POA: Diagnosis not present

## 2020-05-01 DIAGNOSIS — N186 End stage renal disease: Secondary | ICD-10-CM | POA: Diagnosis not present

## 2020-05-01 DIAGNOSIS — Z992 Dependence on renal dialysis: Secondary | ICD-10-CM | POA: Diagnosis not present

## 2020-05-03 DIAGNOSIS — N186 End stage renal disease: Secondary | ICD-10-CM | POA: Diagnosis not present

## 2020-05-03 DIAGNOSIS — Z992 Dependence on renal dialysis: Secondary | ICD-10-CM | POA: Diagnosis not present

## 2020-05-03 DIAGNOSIS — N2581 Secondary hyperparathyroidism of renal origin: Secondary | ICD-10-CM | POA: Diagnosis not present

## 2020-05-06 DIAGNOSIS — N2581 Secondary hyperparathyroidism of renal origin: Secondary | ICD-10-CM | POA: Diagnosis not present

## 2020-05-06 DIAGNOSIS — Z992 Dependence on renal dialysis: Secondary | ICD-10-CM | POA: Diagnosis not present

## 2020-05-06 DIAGNOSIS — N186 End stage renal disease: Secondary | ICD-10-CM | POA: Diagnosis not present

## 2020-05-08 DIAGNOSIS — Z992 Dependence on renal dialysis: Secondary | ICD-10-CM | POA: Diagnosis not present

## 2020-05-08 DIAGNOSIS — N186 End stage renal disease: Secondary | ICD-10-CM | POA: Diagnosis not present

## 2020-05-08 DIAGNOSIS — N2581 Secondary hyperparathyroidism of renal origin: Secondary | ICD-10-CM | POA: Diagnosis not present

## 2020-05-09 DIAGNOSIS — Z1211 Encounter for screening for malignant neoplasm of colon: Secondary | ICD-10-CM | POA: Diagnosis not present

## 2020-05-10 DIAGNOSIS — N186 End stage renal disease: Secondary | ICD-10-CM | POA: Diagnosis not present

## 2020-05-10 DIAGNOSIS — I4891 Unspecified atrial fibrillation: Secondary | ICD-10-CM

## 2020-05-10 DIAGNOSIS — N2581 Secondary hyperparathyroidism of renal origin: Secondary | ICD-10-CM | POA: Diagnosis not present

## 2020-05-10 DIAGNOSIS — Z992 Dependence on renal dialysis: Secondary | ICD-10-CM | POA: Diagnosis not present

## 2020-05-13 DIAGNOSIS — N186 End stage renal disease: Secondary | ICD-10-CM | POA: Diagnosis not present

## 2020-05-13 DIAGNOSIS — Z992 Dependence on renal dialysis: Secondary | ICD-10-CM | POA: Diagnosis not present

## 2020-05-13 DIAGNOSIS — N2581 Secondary hyperparathyroidism of renal origin: Secondary | ICD-10-CM | POA: Diagnosis not present

## 2020-05-15 DIAGNOSIS — N2581 Secondary hyperparathyroidism of renal origin: Secondary | ICD-10-CM | POA: Diagnosis not present

## 2020-05-15 DIAGNOSIS — N186 End stage renal disease: Secondary | ICD-10-CM | POA: Diagnosis not present

## 2020-05-15 DIAGNOSIS — Z992 Dependence on renal dialysis: Secondary | ICD-10-CM | POA: Diagnosis not present

## 2020-05-16 DIAGNOSIS — Z01818 Encounter for other preprocedural examination: Secondary | ICD-10-CM | POA: Diagnosis not present

## 2020-05-16 DIAGNOSIS — N281 Cyst of kidney, acquired: Secondary | ICD-10-CM | POA: Diagnosis not present

## 2020-05-16 DIAGNOSIS — N186 End stage renal disease: Secondary | ICD-10-CM | POA: Diagnosis not present

## 2020-05-17 DIAGNOSIS — N2581 Secondary hyperparathyroidism of renal origin: Secondary | ICD-10-CM | POA: Diagnosis not present

## 2020-05-17 DIAGNOSIS — Z992 Dependence on renal dialysis: Secondary | ICD-10-CM | POA: Diagnosis not present

## 2020-05-17 DIAGNOSIS — N186 End stage renal disease: Secondary | ICD-10-CM | POA: Diagnosis not present

## 2020-05-20 DIAGNOSIS — N186 End stage renal disease: Secondary | ICD-10-CM | POA: Diagnosis not present

## 2020-05-20 DIAGNOSIS — N2581 Secondary hyperparathyroidism of renal origin: Secondary | ICD-10-CM | POA: Diagnosis not present

## 2020-05-20 DIAGNOSIS — Z992 Dependence on renal dialysis: Secondary | ICD-10-CM | POA: Diagnosis not present

## 2020-05-21 DIAGNOSIS — K635 Polyp of colon: Secondary | ICD-10-CM | POA: Diagnosis not present

## 2020-05-21 DIAGNOSIS — D125 Benign neoplasm of sigmoid colon: Secondary | ICD-10-CM | POA: Diagnosis not present

## 2020-05-21 DIAGNOSIS — Z1211 Encounter for screening for malignant neoplasm of colon: Secondary | ICD-10-CM | POA: Diagnosis not present

## 2020-05-22 DIAGNOSIS — N2581 Secondary hyperparathyroidism of renal origin: Secondary | ICD-10-CM | POA: Diagnosis not present

## 2020-05-22 DIAGNOSIS — N186 End stage renal disease: Secondary | ICD-10-CM | POA: Diagnosis not present

## 2020-05-22 DIAGNOSIS — Z992 Dependence on renal dialysis: Secondary | ICD-10-CM | POA: Diagnosis not present

## 2020-05-24 DIAGNOSIS — N2581 Secondary hyperparathyroidism of renal origin: Secondary | ICD-10-CM | POA: Diagnosis not present

## 2020-05-24 DIAGNOSIS — N186 End stage renal disease: Secondary | ICD-10-CM | POA: Diagnosis not present

## 2020-05-24 DIAGNOSIS — Z992 Dependence on renal dialysis: Secondary | ICD-10-CM | POA: Diagnosis not present

## 2020-05-27 DIAGNOSIS — N2581 Secondary hyperparathyroidism of renal origin: Secondary | ICD-10-CM | POA: Diagnosis not present

## 2020-05-27 DIAGNOSIS — N186 End stage renal disease: Secondary | ICD-10-CM | POA: Diagnosis not present

## 2020-05-27 DIAGNOSIS — Z992 Dependence on renal dialysis: Secondary | ICD-10-CM | POA: Diagnosis not present

## 2020-05-27 NOTE — Progress Notes (Signed)
Cardiology Office Note:    Date:  05/29/2020   ID:  Cole [name], DOB September 29, 1970, MRN [id number]  PCP:  Cleophas Dunker, DO  Cardiologist:  None  Electrophysiologist:  None   Referring MD: Cleophas Dunker, *   Chief Complaint  Patient presents with  . Chest Pain    History of Present Illness:    Cole [name] is a 50 y.o. male with a hx of ESRD, hypertension, hyperlipidemia, atrial fibrillation who presents for follow-up.  He was referred by Dr. Sandi Carne for evaluation of chest pain, initially seen on 02/27/2020.  He previously followed in the A. fib clinic, last seen 12/2017 after found to be in A. fib after dialysis.  CHA2DS2-VASc score 1, he was started on aspirin only.  Echocardiogram on 01/31/2018 showed LVEF 55 to 60%, normal RV function, grade 2 diastolic dysfunction, no significant valvular disease.  He reports chest pain started 2 months ago.  Describes left-sided sharp pain that radiates to his left shoulder.  Occurs every 1 to 2 days.  Occurs at rest, lasted about 1 minute and resolves.  Seems improved with massaging the area.  Has not noted relationship with exertion.  He denies any dyspnea, lightheadedness, or lower extremity edema.  Reports had an episode of palpitations 3 days ago where he felt like his heart was racing.  Lasted about 1 minute.  No smoking history.  No family history of heart disease in his immediate family.  Lexiscan Myoview on 03/14/2020 showed normal perfusion, EF 46%.  Echocardiogram on 03/21/2020 showed EF 55 to 123456, grade 1 diastolic dysfunction, normal RV function, no significant valvular disease.  Zio patch was ordered but has not been done.  Since last clinic visit, reports has been doing well.  Denies any further chest pain.  Denies any dyspnea, lightheadedness, syncope, lower extremity edema, or palpitations.  Reports has not been exercising.   Past Medical History:  Diagnosis Date  . Chronic kidney disease   .  Constipation   . GERD (gastroesophageal reflux disease)   . Gout   . Hypercholesteremia   . Hypertension   . Renal failure    on dialysis M/W/F    Past Surgical History:  Procedure Laterality Date  . AV FISTULA PLACEMENT  10/05/2011   Procedure: ARTERIOVENOUS (AV) FISTULA CREATION;  Surgeon: Rosetta Posner, MD;  Location: [place];  Service: Vascular;  Laterality: Left;  . CARDIAC CATHETERIZATION N/A 10/23/2014   Procedure: Right/Left Heart Cath and Coronary Angiography;  Surgeon: Belva Crome, MD;  Location: [place] [facility];  Service: Cardiovascular;  Laterality: N/A;  . FISTULA SUPERFICIALIZATION Left 01/09/2020   Procedure: LEFT ARM FISTULA PLICATION;  Surgeon: Waynetta Sandy, MD;  Location: [place];  Service: Vascular;  Laterality: Left;  . INSERTION OF DIALYSIS CATHETER  10/05/2011   Procedure: INSERTION OF DIALYSIS CATHETER;  Surgeon: Rosetta Posner, MD;  Location: [place];  Service: Vascular;  Laterality: N/A;  right internal jugular vein  . IR AV DIALY SHUNT INTRO NEEDLE/INTRACATH INITIAL W/PTA/IMG LEFT  07/26/2018  . IR DIALY SHUNT INTRO NEEDLE/INTRACATH INITIAL W/IMG LEFT Left 11/25/2017  . IR DIALY SHUNT INTRO NEEDLE/INTRACATH INITIAL W/IMG LEFT Left 11/22/2018  . IR US GUIDE VASC ACCESS LEFT  07/26/2018  . NO PAST SURGERIES    . REVISON OF ARTERIOVENOUS FISTULA Left [id number]   Procedure: PLICATION OF LEFT RADIOCEPHALIC ARTERIOVENOUS FISTULA -PSEUDOANEURYSM TIMES TWO;  Surgeon: Conrad West Wood, MD;  Location: [place];  Service: Vascular;  Laterality: Left;  Current Medications: Current Meds  Medication Sig  . allopurinol (ZYLOPRIM) 100 MG tablet Take 200 mg by mouth daily.  Marland Kitchen aspirin 81 MG EC tablet Take 81 mg by mouth daily.  . calcium elemental as carbonate (BARIATRIC TUMS ULTRA) 400 MG chewable tablet Chew 800 mg by mouth 3 (three) times daily with meals.   . carvedilol (COREG) 6.25 MG tablet Take 1 tablet (6.25 mg total) by mouth as directed. As needed- for breakthrough  afib (Patient taking differently: Take 6.25 mg by mouth 2 (two) times a day.)  . clonazePAM (KLONOPIN) 2 MG tablet Take 2 mg by mouth daily as needed (cramps).   . multivitamin (RENA-VIT) TABS tablet Take 1 tablet by mouth at bedtime.  . simvastatin (ZOCOR) 20 MG tablet Take 20 mg by mouth at bedtime.   Marland Kitchen VITAMIN D PO Take by mouth.  . [DISCONTINUED] HYDROcodone-acetaminophen (NORCO/VICODIN) 5-325 MG tablet Take 1 tablet by mouth every 4 (four) hours as needed for moderate pain.     Allergies:   Bactrim [sulfamethoxazole-trimethoprim]   Social History   Socioeconomic History  . Marital status: Single    Spouse name: Not on file  . Number of children: Not on file  . Years of education: Not on file  . Highest education level: Not on file  Occupational History  . Not on file  Tobacco Use  . Smoking status: Never Smoker  . Smokeless tobacco: Never Used  Substance and Sexual Activity  . Alcohol use: No    Alcohol/week: 0.0 standard drinks  . Drug use: No  . Sexual activity: Yes    Birth control/protection: None  Other Topics Concern  . Not on file  Social History Narrative  . Not on file   Social Determinants of Health   Financial Resource Strain: Not on file  Food Insecurity: Not on file  Transportation Needs: Not on file  Physical Activity: Not on file  Stress: Not on file  Social Connections: Not on file     Family History: The patient's family history includes Hypertension in his mother; Lung cancer in his father.  ROS:   Please see the history of present illness.     All other systems reviewed and are negative.  EKGs/Labs/Other Studies Reviewed:    The following studies were reviewed today:   EKG:  EKG is not ordered today.  The ekg ordered at prior clinic visit demonstrates normal sinus rhythm, rate 71, no ST/T abnormalities  Recent Labs: 01/09/2020: BUN 50; Creatinine, Ser 10.00; Hemoglobin 13.3; Potassium 4.9; Sodium 135  Recent Lipid Panel No results  found for: CHOL, TRIG, HDL, CHOLHDL, VLDL, LDLCALC, LDLDIRECT  Physical Exam:    VS:  BP (!) 150/82   Pulse 75   Ht '5\' 3"'$  (1.6 m)   Wt 144 lb 6.4 oz (65.5 kg)   SpO2 94%   BMI 25.58 kg/m     Wt Readings from Last 3 Encounters:  05/28/20 144 lb 6.4 oz (65.5 kg)  03/14/20 142 lb (64.4 kg)  02/27/20 148 lb (67.1 kg)     GEN:  in no acute distress HEENT: Normal NECK: No JVD; No carotid bruits LYMPHATICS: No lymphadenopathy CARDIAC: RRR, no murmurs, rubs, gallops RESPIRATORY:  Clear to auscultation without rales, wheezing or rhonchi  ABDOMEN: Soft, non-tender, non-distended MUSCULOSKELETAL:  No edema; No deformity  SKIN: Warm and dry NEUROLOGIC:  Alert and oriented x 3 PSYCHIATRIC:  Normal affect   ASSESSMENT:    1. Chest pain of uncertain etiology  2. Atrial fibrillation, unspecified type ([place])   3. Essential hypertension   4. Hyperlipidemia, unspecified hyperlipidemia type    PLAN:    Chest pain: Atypical in description but does have cardiac risk factors (ESRD, hypertension, hyperlipidemia).  Lexiscan Myoview on 03/14/2020 showed normal perfusion, EF 46%.  Echocardiogram on 03/21/2020 showed EF 55 to 123456, grade 1 diastolic dysfunction, normal RV function, no significant valvular disease.  Reports no further chest pain  Atrial fibrillation: CHA2DS2-VASc score 1 (hypertension).  Previously followed in A. fib clinic, last seen in 2019 -Continue aspirin 81 mg daily -Continue carvedilol 6.25 mg twice daily -Reports episode of palpitations were felt like heart was racing.  Zio patch pending  Hyperlipidemia: On simvastatin 20 mg daily  Hypertension: On carvedilol 6.25 mg twice daily.  Mildly elevated in clinic today but reports he did not take his medication this morning.  Reports has been well controlled at dialysis.  ESRD: On MWF HD  RTC in  1 year    Medication Adjustments/Labs and Tests Ordered: Current medicines are reviewed at length with the patient today.   Concerns regarding medicines are outlined above.  No orders of the defined types were placed in this encounter.  No orders of the defined types were placed in this encounter.   Patient Instructions  Medication Instructions:  Your physician recommends that you continue on your current medications as directed. Please refer to the Current Medication list given to you today.  *If you need a refill on your cardiac medications before your next appointment, please call your pharmacy*   Lab Work: None ordered  Testing/Procedures: None ordered   Follow-Up: At [facility], you and your health needs are our priority.  As part of our continuing mission to provide you with exceptional heart care, we have created designated Provider Care Teams.  These Care Teams include your primary Cardiologist (physician) and Advanced Practice Providers (APPs -  Physician Assistants and Nurse Practitioners) who all work together to provide you with the care you need, when you need it.  We recommend signing up for the patient portal called "MyChart".  Sign up information is provided on this After Visit Summary.  MyChart is used to connect with patients for Virtual Visits (Telemedicine).  Patients are able to view lab/test results, encounter notes, upcoming appointments, etc.  Non-urgent messages can be sent to your provider as well.   To learn more about what you can do with MyChart, go to [email or web address].    Your next appointment:   12 month(s)  The format for your next appointment:   In Person  Provider:   Oswaldo Milian, MD        Signed, Donato Heinz, MD  05/29/2020 12:13 AM    [place]

## 2020-05-28 ENCOUNTER — Ambulatory Visit (INDEPENDENT_AMBULATORY_CARE_PROVIDER_SITE_OTHER): Payer: BC Managed Care – PPO | Admitting: Cardiology

## 2020-05-28 ENCOUNTER — Other Ambulatory Visit: Payer: Self-pay

## 2020-05-28 ENCOUNTER — Encounter: Payer: Self-pay | Admitting: Cardiology

## 2020-05-28 VITALS — BP 150/82 | HR 75 | Ht 63.0 in | Wt 144.4 lb

## 2020-05-28 DIAGNOSIS — R079 Chest pain, unspecified: Secondary | ICD-10-CM

## 2020-05-28 DIAGNOSIS — E785 Hyperlipidemia, unspecified: Secondary | ICD-10-CM | POA: Diagnosis not present

## 2020-05-28 DIAGNOSIS — I10 Essential (primary) hypertension: Secondary | ICD-10-CM | POA: Diagnosis not present

## 2020-05-28 DIAGNOSIS — I4891 Unspecified atrial fibrillation: Secondary | ICD-10-CM

## 2020-05-28 NOTE — Patient Instructions (Signed)
Medication Instructions:  Your physician recommends that you continue on your current medications as directed. Please refer to the Current Medication list given to you today.  *If you need a refill on your cardiac medications before your next appointment, please call your pharmacy*   Lab Work: None ordered  Testing/Procedures: None ordered   Follow-Up: At [facility], you and your health needs are our priority.  As part of our continuing mission to provide you with exceptional heart care, we have created designated Provider Care Teams.  These Care Teams include your primary Cardiologist (physician) and Advanced Practice Providers (APPs -  Physician Assistants and Nurse Practitioners) who all work together to provide you with the care you need, when you need it.  We recommend signing up for the patient portal called "MyChart".  Sign up information is provided on this After Visit Summary.  MyChart is used to connect with patients for Virtual Visits (Telemedicine).  Patients are able to view lab/test results, encounter notes, upcoming appointments, etc.  Non-urgent messages can be sent to your provider as well.   To learn more about what you can do with MyChart, go to [email or web address].    Your next appointment:   12 month(s)  The format for your next appointment:   In Person  Provider:   Oswaldo Milian, MD

## 2020-05-29 DIAGNOSIS — Z992 Dependence on renal dialysis: Secondary | ICD-10-CM | POA: Diagnosis not present

## 2020-05-29 DIAGNOSIS — N2581 Secondary hyperparathyroidism of renal origin: Secondary | ICD-10-CM | POA: Diagnosis not present

## 2020-05-29 DIAGNOSIS — N186 End stage renal disease: Secondary | ICD-10-CM | POA: Diagnosis not present

## 2020-05-31 DIAGNOSIS — N2581 Secondary hyperparathyroidism of renal origin: Secondary | ICD-10-CM | POA: Diagnosis not present

## 2020-05-31 DIAGNOSIS — N186 End stage renal disease: Secondary | ICD-10-CM | POA: Diagnosis not present

## 2020-05-31 DIAGNOSIS — Z992 Dependence on renal dialysis: Secondary | ICD-10-CM | POA: Diagnosis not present

## 2020-06-03 DIAGNOSIS — Z992 Dependence on renal dialysis: Secondary | ICD-10-CM | POA: Diagnosis not present

## 2020-06-03 DIAGNOSIS — N2581 Secondary hyperparathyroidism of renal origin: Secondary | ICD-10-CM | POA: Diagnosis not present

## 2020-06-03 DIAGNOSIS — N186 End stage renal disease: Secondary | ICD-10-CM | POA: Diagnosis not present

## 2020-06-03 DIAGNOSIS — I4891 Unspecified atrial fibrillation: Secondary | ICD-10-CM | POA: Diagnosis not present

## 2020-06-05 DIAGNOSIS — N186 End stage renal disease: Secondary | ICD-10-CM | POA: Diagnosis not present

## 2020-06-05 DIAGNOSIS — N2581 Secondary hyperparathyroidism of renal origin: Secondary | ICD-10-CM | POA: Diagnosis not present

## 2020-06-05 DIAGNOSIS — Z992 Dependence on renal dialysis: Secondary | ICD-10-CM | POA: Diagnosis not present

## 2020-06-07 DIAGNOSIS — Z992 Dependence on renal dialysis: Secondary | ICD-10-CM | POA: Diagnosis not present

## 2020-06-07 DIAGNOSIS — N2581 Secondary hyperparathyroidism of renal origin: Secondary | ICD-10-CM | POA: Diagnosis not present

## 2020-06-07 DIAGNOSIS — N186 End stage renal disease: Secondary | ICD-10-CM | POA: Diagnosis not present

## 2020-06-10 DIAGNOSIS — N186 End stage renal disease: Secondary | ICD-10-CM | POA: Diagnosis not present

## 2020-06-10 DIAGNOSIS — Z992 Dependence on renal dialysis: Secondary | ICD-10-CM | POA: Diagnosis not present

## 2020-06-10 DIAGNOSIS — N2581 Secondary hyperparathyroidism of renal origin: Secondary | ICD-10-CM | POA: Diagnosis not present

## 2020-06-12 DIAGNOSIS — Z992 Dependence on renal dialysis: Secondary | ICD-10-CM | POA: Diagnosis not present

## 2020-06-12 DIAGNOSIS — N2581 Secondary hyperparathyroidism of renal origin: Secondary | ICD-10-CM | POA: Diagnosis not present

## 2020-06-12 DIAGNOSIS — N186 End stage renal disease: Secondary | ICD-10-CM | POA: Diagnosis not present

## 2020-06-14 DIAGNOSIS — N2581 Secondary hyperparathyroidism of renal origin: Secondary | ICD-10-CM | POA: Diagnosis not present

## 2020-06-14 DIAGNOSIS — Z992 Dependence on renal dialysis: Secondary | ICD-10-CM | POA: Diagnosis not present

## 2020-06-14 DIAGNOSIS — N186 End stage renal disease: Secondary | ICD-10-CM | POA: Diagnosis not present

## 2020-06-17 DIAGNOSIS — N2581 Secondary hyperparathyroidism of renal origin: Secondary | ICD-10-CM | POA: Diagnosis not present

## 2020-06-17 DIAGNOSIS — Z992 Dependence on renal dialysis: Secondary | ICD-10-CM | POA: Diagnosis not present

## 2020-06-17 DIAGNOSIS — N186 End stage renal disease: Secondary | ICD-10-CM | POA: Diagnosis not present

## 2020-06-19 DIAGNOSIS — N186 End stage renal disease: Secondary | ICD-10-CM | POA: Diagnosis not present

## 2020-06-19 DIAGNOSIS — N2581 Secondary hyperparathyroidism of renal origin: Secondary | ICD-10-CM | POA: Diagnosis not present

## 2020-06-19 DIAGNOSIS — Z992 Dependence on renal dialysis: Secondary | ICD-10-CM | POA: Diagnosis not present

## 2020-06-21 DIAGNOSIS — Z992 Dependence on renal dialysis: Secondary | ICD-10-CM | POA: Diagnosis not present

## 2020-06-21 DIAGNOSIS — N186 End stage renal disease: Secondary | ICD-10-CM | POA: Diagnosis not present

## 2020-06-21 DIAGNOSIS — N2581 Secondary hyperparathyroidism of renal origin: Secondary | ICD-10-CM | POA: Diagnosis not present

## 2020-06-24 DIAGNOSIS — N2581 Secondary hyperparathyroidism of renal origin: Secondary | ICD-10-CM | POA: Diagnosis not present

## 2020-06-24 DIAGNOSIS — N186 End stage renal disease: Secondary | ICD-10-CM | POA: Diagnosis not present

## 2020-06-24 DIAGNOSIS — Z992 Dependence on renal dialysis: Secondary | ICD-10-CM | POA: Diagnosis not present

## 2020-06-26 DIAGNOSIS — Z992 Dependence on renal dialysis: Secondary | ICD-10-CM | POA: Diagnosis not present

## 2020-06-26 DIAGNOSIS — N2581 Secondary hyperparathyroidism of renal origin: Secondary | ICD-10-CM | POA: Diagnosis not present

## 2020-06-26 DIAGNOSIS — N186 End stage renal disease: Secondary | ICD-10-CM | POA: Diagnosis not present

## 2020-06-28 DIAGNOSIS — N2581 Secondary hyperparathyroidism of renal origin: Secondary | ICD-10-CM | POA: Diagnosis not present

## 2020-06-28 DIAGNOSIS — N186 End stage renal disease: Secondary | ICD-10-CM | POA: Diagnosis not present

## 2020-06-28 DIAGNOSIS — Z992 Dependence on renal dialysis: Secondary | ICD-10-CM | POA: Diagnosis not present

## 2020-07-01 DIAGNOSIS — N2581 Secondary hyperparathyroidism of renal origin: Secondary | ICD-10-CM | POA: Diagnosis not present

## 2020-07-01 DIAGNOSIS — Z992 Dependence on renal dialysis: Secondary | ICD-10-CM | POA: Diagnosis not present

## 2020-07-01 DIAGNOSIS — N186 End stage renal disease: Secondary | ICD-10-CM | POA: Diagnosis not present

## 2020-07-03 DIAGNOSIS — N2581 Secondary hyperparathyroidism of renal origin: Secondary | ICD-10-CM | POA: Diagnosis not present

## 2020-07-03 DIAGNOSIS — Z992 Dependence on renal dialysis: Secondary | ICD-10-CM | POA: Diagnosis not present

## 2020-07-03 DIAGNOSIS — N186 End stage renal disease: Secondary | ICD-10-CM | POA: Diagnosis not present

## 2020-07-05 DIAGNOSIS — N2581 Secondary hyperparathyroidism of renal origin: Secondary | ICD-10-CM | POA: Diagnosis not present

## 2020-07-05 DIAGNOSIS — Z992 Dependence on renal dialysis: Secondary | ICD-10-CM | POA: Diagnosis not present

## 2020-07-05 DIAGNOSIS — N186 End stage renal disease: Secondary | ICD-10-CM | POA: Diagnosis not present

## 2020-07-08 ENCOUNTER — Telehealth: Payer: Self-pay | Admitting: Cardiology

## 2020-07-08 DIAGNOSIS — Z992 Dependence on renal dialysis: Secondary | ICD-10-CM | POA: Diagnosis not present

## 2020-07-08 DIAGNOSIS — N2581 Secondary hyperparathyroidism of renal origin: Secondary | ICD-10-CM | POA: Diagnosis not present

## 2020-07-08 DIAGNOSIS — N186 End stage renal disease: Secondary | ICD-10-CM | POA: Diagnosis not present

## 2020-07-08 NOTE — Telephone Encounter (Signed)
PATIENT WOULD LIKE A PHONE WITH HIS MOST RECENT MONITOR RESULTS . PLEASE CALL [phone]  (NEEDS SPANISH SPEAKING ) PATIENT STATES HE MISSED A CALL FROM [place] LAST WEEK

## 2020-07-08 NOTE — Telephone Encounter (Signed)
Patient is returning call to discuss his monitor results.

## 2020-07-08 NOTE — Telephone Encounter (Signed)
Attempt to return call via interpreter (ID # [id number]) no answer and unable to leave VM (VM full)

## 2020-07-09 NOTE — Telephone Encounter (Signed)
Pt is returning call from 07/08/20, pt needs spanish speaking interpreter. Pt is working today and he is requesting a return call after 4pm .Please advise

## 2020-07-09 NOTE — Telephone Encounter (Signed)
Spoke to patient through a [place] interpreter.Monitor results given.Appointment scheduled with Dr.Schumann 08/15/20 at 9:30 am to discuss results.

## 2020-07-10 DIAGNOSIS — N2581 Secondary hyperparathyroidism of renal origin: Secondary | ICD-10-CM | POA: Diagnosis not present

## 2020-07-10 DIAGNOSIS — N186 End stage renal disease: Secondary | ICD-10-CM | POA: Diagnosis not present

## 2020-07-10 DIAGNOSIS — Z992 Dependence on renal dialysis: Secondary | ICD-10-CM | POA: Diagnosis not present

## 2020-07-12 DIAGNOSIS — N2581 Secondary hyperparathyroidism of renal origin: Secondary | ICD-10-CM | POA: Diagnosis not present

## 2020-07-12 DIAGNOSIS — Z992 Dependence on renal dialysis: Secondary | ICD-10-CM | POA: Diagnosis not present

## 2020-07-12 DIAGNOSIS — N186 End stage renal disease: Secondary | ICD-10-CM | POA: Diagnosis not present

## 2020-07-15 DIAGNOSIS — Z992 Dependence on renal dialysis: Secondary | ICD-10-CM | POA: Diagnosis not present

## 2020-07-15 DIAGNOSIS — N186 End stage renal disease: Secondary | ICD-10-CM | POA: Diagnosis not present

## 2020-07-15 DIAGNOSIS — N2581 Secondary hyperparathyroidism of renal origin: Secondary | ICD-10-CM | POA: Diagnosis not present

## 2020-07-17 DIAGNOSIS — N186 End stage renal disease: Secondary | ICD-10-CM | POA: Diagnosis not present

## 2020-07-17 DIAGNOSIS — Z992 Dependence on renal dialysis: Secondary | ICD-10-CM | POA: Diagnosis not present

## 2020-07-17 DIAGNOSIS — N2581 Secondary hyperparathyroidism of renal origin: Secondary | ICD-10-CM | POA: Diagnosis not present

## 2020-07-19 DIAGNOSIS — N186 End stage renal disease: Secondary | ICD-10-CM | POA: Diagnosis not present

## 2020-07-19 DIAGNOSIS — Z992 Dependence on renal dialysis: Secondary | ICD-10-CM | POA: Diagnosis not present

## 2020-07-19 DIAGNOSIS — N2581 Secondary hyperparathyroidism of renal origin: Secondary | ICD-10-CM | POA: Diagnosis not present

## 2020-07-22 ENCOUNTER — Telehealth: Payer: Self-pay | Admitting: *Deleted

## 2020-07-22 DIAGNOSIS — N2581 Secondary hyperparathyroidism of renal origin: Secondary | ICD-10-CM | POA: Diagnosis not present

## 2020-07-22 DIAGNOSIS — Z992 Dependence on renal dialysis: Secondary | ICD-10-CM | POA: Diagnosis not present

## 2020-07-22 DIAGNOSIS — N186 End stage renal disease: Secondary | ICD-10-CM | POA: Diagnosis not present

## 2020-07-22 NOTE — Telephone Encounter (Signed)
Received notification that patient walked in requesting sooner appt to discuss increased episodes of chest pain.    Called patient to discuss: patient reports he continues to have the same chest pain as previously seen for but this has become more frequent.  He states the pain happens daily, every 15-30 mins but only last about 1 min.   Reports pain is sharp, gets better with massage.    Denies any heavy lifting, denies SOB/N/V/D.    States pain has been waking him up from sleep.    Reports pain can be with rest, exertion, sleep, etc.    Not consistent.    Appt was already moved up to 7/14 with APP  Advised patient to keep upcoming appt to discuss further, call PCP for evaluation of noncardiac causes and if symptoms change/worsen to proceed to ER for evaluation.  Patient verbalized understanding.   Routed to MD to review for any further recommendations.

## 2020-07-22 NOTE — Telephone Encounter (Signed)
Agree with plan 

## 2020-07-24 DIAGNOSIS — N2581 Secondary hyperparathyroidism of renal origin: Secondary | ICD-10-CM | POA: Diagnosis not present

## 2020-07-24 DIAGNOSIS — Z992 Dependence on renal dialysis: Secondary | ICD-10-CM | POA: Diagnosis not present

## 2020-07-24 DIAGNOSIS — N186 End stage renal disease: Secondary | ICD-10-CM | POA: Diagnosis not present

## 2020-07-26 DIAGNOSIS — Z992 Dependence on renal dialysis: Secondary | ICD-10-CM | POA: Diagnosis not present

## 2020-07-26 DIAGNOSIS — N186 End stage renal disease: Secondary | ICD-10-CM | POA: Diagnosis not present

## 2020-07-26 DIAGNOSIS — N2581 Secondary hyperparathyroidism of renal origin: Secondary | ICD-10-CM | POA: Diagnosis not present

## 2020-07-29 DIAGNOSIS — N2581 Secondary hyperparathyroidism of renal origin: Secondary | ICD-10-CM | POA: Diagnosis not present

## 2020-07-29 DIAGNOSIS — N186 End stage renal disease: Secondary | ICD-10-CM | POA: Diagnosis not present

## 2020-07-29 DIAGNOSIS — Z992 Dependence on renal dialysis: Secondary | ICD-10-CM | POA: Diagnosis not present

## 2020-07-31 DIAGNOSIS — N186 End stage renal disease: Secondary | ICD-10-CM | POA: Diagnosis not present

## 2020-07-31 DIAGNOSIS — Z992 Dependence on renal dialysis: Secondary | ICD-10-CM | POA: Diagnosis not present

## 2020-07-31 DIAGNOSIS — N2581 Secondary hyperparathyroidism of renal origin: Secondary | ICD-10-CM | POA: Diagnosis not present

## 2020-08-02 DIAGNOSIS — Z992 Dependence on renal dialysis: Secondary | ICD-10-CM | POA: Diagnosis not present

## 2020-08-02 DIAGNOSIS — N2581 Secondary hyperparathyroidism of renal origin: Secondary | ICD-10-CM | POA: Diagnosis not present

## 2020-08-02 DIAGNOSIS — N186 End stage renal disease: Secondary | ICD-10-CM | POA: Diagnosis not present

## 2020-08-03 DIAGNOSIS — Z940 Kidney transplant status: Secondary | ICD-10-CM | POA: Diagnosis not present

## 2020-08-03 DIAGNOSIS — N186 End stage renal disease: Secondary | ICD-10-CM | POA: Diagnosis not present

## 2020-08-03 DIAGNOSIS — Z01818 Encounter for other preprocedural examination: Secondary | ICD-10-CM | POA: Diagnosis not present

## 2020-08-03 DIAGNOSIS — I120 Hypertensive chronic kidney disease with stage 5 chronic kidney disease or end stage renal disease: Secondary | ICD-10-CM | POA: Diagnosis not present

## 2020-08-04 DIAGNOSIS — I10 Essential (primary) hypertension: Secondary | ICD-10-CM | POA: Diagnosis not present

## 2020-08-04 DIAGNOSIS — D84821 Immunodeficiency due to drugs: Secondary | ICD-10-CM | POA: Diagnosis not present

## 2020-08-04 DIAGNOSIS — Z4822 Encounter for aftercare following kidney transplant: Secondary | ICD-10-CM | POA: Diagnosis not present

## 2020-08-04 DIAGNOSIS — Z79899 Other long term (current) drug therapy: Secondary | ICD-10-CM | POA: Diagnosis not present

## 2020-08-05 DIAGNOSIS — Z949 Transplanted organ and tissue status, unspecified: Secondary | ICD-10-CM | POA: Diagnosis not present

## 2020-08-05 DIAGNOSIS — R0602 Shortness of breath: Secondary | ICD-10-CM | POA: Diagnosis not present

## 2020-08-05 DIAGNOSIS — I10 Essential (primary) hypertension: Secondary | ICD-10-CM | POA: Diagnosis not present

## 2020-08-05 DIAGNOSIS — R079 Chest pain, unspecified: Secondary | ICD-10-CM | POA: Diagnosis not present

## 2020-08-05 DIAGNOSIS — K567 Ileus, unspecified: Secondary | ICD-10-CM | POA: Diagnosis not present

## 2020-08-05 DIAGNOSIS — Z940 Kidney transplant status: Secondary | ICD-10-CM | POA: Diagnosis not present

## 2020-08-05 DIAGNOSIS — K9189 Other postprocedural complications and disorders of digestive system: Secondary | ICD-10-CM | POA: Diagnosis not present

## 2020-08-07 DIAGNOSIS — T8619 Other complication of kidney transplant: Secondary | ICD-10-CM | POA: Diagnosis not present

## 2020-08-07 DIAGNOSIS — N186 End stage renal disease: Secondary | ICD-10-CM | POA: Diagnosis not present

## 2020-08-07 DIAGNOSIS — I120 Hypertensive chronic kidney disease with stage 5 chronic kidney disease or end stage renal disease: Secondary | ICD-10-CM | POA: Diagnosis not present

## 2020-08-07 DIAGNOSIS — Z992 Dependence on renal dialysis: Secondary | ICD-10-CM | POA: Diagnosis not present

## 2020-08-08 ENCOUNTER — Ambulatory Visit (HOSPITAL_BASED_OUTPATIENT_CLINIC_OR_DEPARTMENT_OTHER): Payer: BC Managed Care – PPO | Admitting: Family

## 2020-08-09 DIAGNOSIS — I10 Essential (primary) hypertension: Secondary | ICD-10-CM | POA: Diagnosis not present

## 2020-08-09 DIAGNOSIS — T8619 Other complication of kidney transplant: Secondary | ICD-10-CM | POA: Diagnosis not present

## 2020-08-09 DIAGNOSIS — D849 Immunodeficiency, unspecified: Secondary | ICD-10-CM | POA: Diagnosis not present

## 2020-08-09 DIAGNOSIS — Z940 Kidney transplant status: Secondary | ICD-10-CM | POA: Diagnosis not present

## 2020-08-12 DIAGNOSIS — D849 Immunodeficiency, unspecified: Secondary | ICD-10-CM | POA: Diagnosis not present

## 2020-08-12 DIAGNOSIS — I10 Essential (primary) hypertension: Secondary | ICD-10-CM | POA: Diagnosis not present

## 2020-08-12 DIAGNOSIS — Z940 Kidney transplant status: Secondary | ICD-10-CM | POA: Diagnosis not present

## 2020-08-12 DIAGNOSIS — Z4822 Encounter for aftercare following kidney transplant: Secondary | ICD-10-CM | POA: Diagnosis not present

## 2020-08-14 ENCOUNTER — Encounter: Payer: Self-pay | Admitting: *Deleted

## 2020-08-14 DIAGNOSIS — D849 Immunodeficiency, unspecified: Secondary | ICD-10-CM | POA: Diagnosis not present

## 2020-08-14 DIAGNOSIS — I10 Essential (primary) hypertension: Secondary | ICD-10-CM | POA: Diagnosis not present

## 2020-08-14 DIAGNOSIS — Z940 Kidney transplant status: Secondary | ICD-10-CM | POA: Diagnosis not present

## 2020-08-14 DIAGNOSIS — Z4822 Encounter for aftercare following kidney transplant: Secondary | ICD-10-CM | POA: Diagnosis not present

## 2020-08-15 ENCOUNTER — Ambulatory Visit: Payer: BC Managed Care – PPO | Admitting: Cardiology

## 2020-08-27 ENCOUNTER — Encounter (HOSPITAL_BASED_OUTPATIENT_CLINIC_OR_DEPARTMENT_OTHER): Payer: Self-pay

## 2020-08-27 ENCOUNTER — Ambulatory Visit (INDEPENDENT_AMBULATORY_CARE_PROVIDER_SITE_OTHER): Payer: BC Managed Care – PPO | Admitting: Family

## 2020-08-27 DIAGNOSIS — I4891 Unspecified atrial fibrillation: Secondary | ICD-10-CM

## 2020-08-27 NOTE — Progress Notes (Signed)
Patient was no-show. Will no-charge visit.   Loel Dubonnet, NP

## 2020-10-10 ENCOUNTER — Ambulatory Visit (HOSPITAL_BASED_OUTPATIENT_CLINIC_OR_DEPARTMENT_OTHER): Payer: BC Managed Care – PPO | Admitting: Family

## 2020-10-25 ENCOUNTER — Ambulatory Visit: Payer: BC Managed Care – PPO | Admitting: Cardiology

## 2020-10-25 ENCOUNTER — Encounter: Payer: Self-pay | Admitting: Cardiology

## 2020-10-25 ENCOUNTER — Ambulatory Visit: Payer: BC Managed Care – PPO | Admitting: Physician Assistant

## 2020-10-25 ENCOUNTER — Other Ambulatory Visit: Payer: Self-pay

## 2020-10-25 VITALS — BP 120/70 | HR 71 | Ht 65.0 in | Wt 148.4 lb

## 2020-10-25 DIAGNOSIS — E785 Hyperlipidemia, unspecified: Secondary | ICD-10-CM | POA: Diagnosis not present

## 2020-10-25 DIAGNOSIS — I10 Essential (primary) hypertension: Secondary | ICD-10-CM

## 2020-10-25 DIAGNOSIS — I480 Paroxysmal atrial fibrillation: Secondary | ICD-10-CM

## 2020-10-25 NOTE — Progress Notes (Signed)
Cardiology Office Note:    Date:  10/25/2020   ID:  Bright Kille, DOB [birth year], MRN [id number]  PCP:  Alen Bleacher, MD  Cardiologist:  Donato Heinz, MD  Electrophysiologist:  None   Referring MD: Alen Bleacher, MD   Chief Complaint  Patient presents with   Atrial Fibrillation     History of Present Illness:    Cole [name] is a 50 y.o. male with a hx of ESRD, hypertension, hyperlipidemia, atrial fibrillation who presents for follow-up.  Initially seen on 02/27/2020 by Dr Gardiner Rhyme.  He previously followed in the A. fib clinic,  seen 12/2017 after found to be in A. fib after dialysis.  CHA2DS2-VASc score 1, he was started on aspirin only.  Echocardiogram on 01/31/2018 showed LVEF 55 to 60%, normal RV function, grade 2 diastolic dysfunction, no significant valvular disease.  He was seen again by Dr Gardiner Rhyme in May with chest pain and palpitations.   Lexiscan Myoview on 03/14/2020 showed normal perfusion, EF 46%.  Echocardiogram on 03/21/2020 showed EF 55 to 123456, grade 1 diastolic dysfunction, normal RV function, no significant valvular disease.  Zio patch and showed Afib burden of 3% with longest episode lasting 7 minutes.  He was admitted in 05-Sep-2022 at [facility] for deceased donor kidney transplant without complications.   On follow up today he is doing very well. Has recovered from his transplant and reports kidney function is good. He can now eat better and doesn't have to worry about water intake. Feels very  well. Does note some palpiations daily but last less than 1 minute. No dizziness or SOB. Some pain radiating to left axilla. Is on ASA. States he was told to take this 6 years ago.   Past Medical History:  Diagnosis Date   Chronic kidney disease    Constipation    GERD (gastroesophageal reflux disease)    Gout    Hypercholesteremia    Hypertension    Renal failure    on dialysis M/W/F    Past Surgical History:  Procedure Laterality Date   AV FISTULA  PLACEMENT  10/05/2011   Procedure: ARTERIOVENOUS (AV) FISTULA CREATION;  Surgeon: Rosetta Posner, MD;  Location: [facility] OR;  Service: Vascular;  Laterality: Left;   CARDIAC CATHETERIZATION N/A 10/23/2014   Procedure: Right/Left Heart Cath and Coronary Angiography;  Surgeon: Belva Crome, MD;  Location: [place] [facility];  Service: Cardiovascular;  Laterality: N/A;   FISTULA SUPERFICIALIZATION Left 01/09/2020   Procedure: LEFT ARM FISTULA PLICATION;  Surgeon: Waynetta Sandy, MD;  Location: [place];  Service: Vascular;  Laterality: Left;   INSERTION OF DIALYSIS CATHETER  10/05/2011   Procedure: INSERTION OF DIALYSIS CATHETER;  Surgeon: Rosetta Posner, MD;  Location: [place];  Service: Vascular;  Laterality: N/A;  right internal jugular vein   IR AV DIALY SHUNT INTRO NEEDLE/INTRACATH INITIAL W/PTA/IMG LEFT  07/26/2018   IR DIALY SHUNT INTRO NEEDLE/INTRACATH INITIAL W/IMG LEFT Left 11/25/2017   IR DIALY SHUNT INTRO NEEDLE/INTRACATH INITIAL W/IMG LEFT Left 11/22/2018   IR US GUIDE VASC ACCESS LEFT  07/26/2018   NO PAST SURGERIES     REVISON OF ARTERIOVENOUS FISTULA Left [id number]   Procedure: PLICATION OF LEFT RADIOCEPHALIC ARTERIOVENOUS FISTULA -PSEUDOANEURYSM TIMES TWO;  Surgeon: Conrad Aubrey, MD;  Location: [facility] OR;  Service: Vascular;  Laterality: Left;    Current Medications: Current Meds  Medication Sig   amLODipine (NORVASC) 5 MG tablet Take 5 mg by mouth daily.   aspirin 81 MG EC tablet  Take 81 mg by mouth daily.   carvedilol (COREG) 6.25 MG tablet Take 1 tablet (6.25 mg total) by mouth as directed. As needed- for breakthrough afib (Patient taking differently: Take 6.25 mg by mouth 2 (two) times a day.)   mycophenolate (MYFORTIC) 180 MG EC tablet Take 180 mg by mouth in the morning, at noon, in the evening, and at bedtime. 4 tabs 4 times daily   pantoprazole (PROTONIX) 40 MG tablet Take 40 mg by mouth daily.   predniSONE (DELTASONE) 5 MG tablet Take 10 mg by mouth 2 (two) times daily.    simvastatin (ZOCOR) 20 MG tablet Take 20 mg by mouth at bedtime.    sulfamethoxazole-trimethoprim (BACTRIM) 400-80 MG tablet Take 1 tablet by mouth 3 (three) times a week.   tacrolimus (PROGRAF) 0.5 MG capsule Take 0.5 mg by mouth at bedtime.   tacrolimus (PROGRAF) 1 MG capsule Take 1 mg by mouth daily.   valGANciclovir (VALCYTE) 450 MG tablet Take 450 mg by mouth daily.     Allergies:   Bactrim [sulfamethoxazole-trimethoprim]   Social History   Socioeconomic History   Marital status: Single    Spouse name: Not on file   Number of children: Not on file   Years of education: Not on file   Highest education level: Not on file  Occupational History   Not on file  Tobacco Use   Smoking status: Never   Smokeless tobacco: Never  Substance and Sexual Activity   Alcohol use: No    Alcohol/week: 0.0 standard drinks   Drug use: No   Sexual activity: Yes    Birth control/protection: None  Other Topics Concern   Not on file  Social History Narrative   Not on file   Social Determinants of Health   Financial Resource Strain: Not on file  Food Insecurity: Not on file  Transportation Needs: Not on file  Physical Activity: Not on file  Stress: Not on file  Social Connections: Not on file     Family History: The patient's family history includes Hypertension in his mother; Lung cancer in his father.  ROS:   Please see the history of present illness.     All other systems reviewed and are negative.  EKGs/Labs/Other Studies Reviewed:    The following studies were reviewed today:   EKG:  EKG is ordered today. NSR rate 71. Normal. I have personally reviewed and interpreted this study.   Recent Labs: 01/09/2020: BUN 50; Creatinine, Ser 10.00; Hemoglobin 13.3; Potassium 4.9; Sodium 135  Recent Lipid Panel No results found for: CHOL, TRIG, HDL, CHOLHDL, VLDL, LDLCALC, LDLDIRECT  Dated 09/04/20: cholesterol 175, LDL 67.,HDL 41 Dated 10/18/20: creatinine 1.12. otherwise CMET  normal., Hgb 12.9, WBC 2.6K, Plts 149K  Event monitor 06/03/20: Study Highlights    3% A. fib burden, average heart rate 117. Longest episode lasted 7 minutes with heart rate 108 8 episodes of NSVT, longest lasting 5 beats     Patch Wear Time:  14 days and 0 hours (2022-04-15T22:44:27-0400 to 2022-04-29T22:44:31-0400)   Patient had a min HR of 52 bpm, max HR of 177 bpm, and avg HR of 79 bpm. Predominant underlying rhythm was Sinus Rhythm. 8 Ventricular Tachycardia runs occurred, the run with the fastest interval lasting 4 beats with a max rate of 174 bpm, the longest  lasting 5 beats with an avg rate of 151 bpm. Episodes of Ventricular Tachycardia may be possible Atrial Fibrillation/Flutter with aberrancy. Atrial Fibrillation/Flutter occurred (3% burden), ranging from 69-177 bpm (  avg of 117 bpm), the longest lasting 7  mins 1 sec with an avg rate of 108 bpm. Atrial Fibrillation/Flutter was detected within +/- 45 seconds of symptomatic patient event(s). Isolated SVEs were rare (<1.0%), SVE Couplets were rare (<1.0%), and SVE Triplets were rare (<1.0%). Isolated VEs  were rare (<1.0%, 3645), VE Couplets were rare (<1.0%, 182), and VE Triplets were rare (<1.0%, 35).  7 patient triggered events, corresponding sinus rhythm  PACs/PVCs and A. fib   Echo 03/21/20: IMPRESSIONS     1. Left ventricular ejection fraction, by estimation, is 55 to 60%. The  left ventricle has normal function. The left ventricle has no regional  wall motion abnormalities. There is mild left ventricular hypertrophy.  Left ventricular diastolic parameters  are consistent with Grade I diastolic dysfunction (impaired relaxation).   2. Right ventricular systolic function is normal. The right ventricular  size is normal. Tricuspid regurgitation signal is inadequate for assessing  PA pressure.   3. The mitral valve is normal in structure. Trivial mitral valve  regurgitation. No evidence of mitral stenosis.   4. The aortic valve  is tricuspid. Aortic valve regurgitation is not  visualized. No aortic stenosis is present.   5. The inferior vena cava is normal in size with greater than 50%  respiratory variability, suggesting right atrial pressure of 3 mmHg.   Myoview 03/14/20: Study Highlights    The left ventricular ejection fraction is mildly decreased (45-54%). There was no ST segment deviation noted during stress. Nuclear stress EF: 46%. Mildly reduced ejection fraction. Generalized hypokinesis. This is an intermediate risk study based upon mildly reduced ejection fraction. There is no evidence of ischemia or infarction.   Candee Furbish, MD  Physical Exam:    VS:  BP 120/70 (BP Location: Right Arm)   Pulse 71   Ht '5\' 5"'$  (1.651 m)   Wt 148 lb 6.4 oz (67.3 kg)   SpO2 99%   BMI 24.70 kg/m     Wt Readings from Last 3 Encounters:  10/25/20 148 lb 6.4 oz (67.3 kg)  05/28/20 144 lb 6.4 oz (65.5 kg)  03/14/20 142 lb (64.4 kg)     GEN:  in no acute distress HEENT: Normal NECK: No JVD; No carotid bruits LYMPHATICS: No lymphadenopathy CARDIAC: RRR, no murmurs, rubs, gallops RESPIRATORY:  Clear to auscultation without rales, wheezing or rhonchi  ABDOMEN: Soft, non-tender, non-distended MUSCULOSKELETAL:  No edema; No deformity. Large functioning AV fistula left forearm. SKIN: Warm and dry NEUROLOGIC:  Alert and oriented x 3 PSYCHIATRIC:  Normal affect   ASSESSMENT:    1. Paroxysmal atrial fibrillation (HCC)   2. Hyperlipidemia, unspecified hyperlipidemia type   3. Essential hypertension     PLAN:     Atrial fibrillation: Paroxysmal. CHA2DS2-VASc score 1 (hypertension).  Previously followed in A. fib clinic, last seen in 2019 -Continue aspirin 81 mg daily -Continue carvedilol 6.25 mg twice daily - event monitor shows burden of 3% without sustained episodes -Reviewed event monitor report with patient today.  - no anticoagulation indicated   Hyperlipidemia: On simvastatin 20 mg  daily  Hypertension: well controlled  ESRD: now s/p renal transplant.   Follow up with Dr Gardiner Rhyme in 6 months.    Medication Adjustments/Labs and Tests Ordered: Current medicines are reviewed at length with the patient today.  Concerns regarding medicines are outlined above.  No orders of the defined types were placed in this encounter.  No orders of the defined types were placed in this encounter.   There  are no Patient Instructions on file for this visit.   Signed, Maevyn Riordan [place], MD  10/25/2020 4:54 PM    [facility]

## 2021-03-31 ENCOUNTER — Ambulatory Visit: Payer: BC Managed Care – PPO | Admitting: Cardiology

## 2021-04-15 ENCOUNTER — Ambulatory Visit: Payer: BC Managed Care – PPO | Admitting: Cardiology
